# Patient Record
Sex: Female | Born: 1950 | Race: White | Hispanic: No | Marital: Married | Smoking: Former smoker
Health system: Southern US, Community
[De-identification: ages and names within clinical notes are randomized; demographics above are authoritative.]

## PROBLEM LIST (undated history)

## (undated) DIAGNOSIS — F329 Major depressive disorder, single episode, unspecified: Secondary | ICD-10-CM

## (undated) DIAGNOSIS — IMO0001 Reserved for inherently not codable concepts without codable children: Secondary | ICD-10-CM

## (undated) DIAGNOSIS — R011 Cardiac murmur, unspecified: Secondary | ICD-10-CM

## (undated) DIAGNOSIS — I10 Essential (primary) hypertension: Secondary | ICD-10-CM

## (undated) DIAGNOSIS — R0602 Shortness of breath: Secondary | ICD-10-CM

## (undated) DIAGNOSIS — I4892 Unspecified atrial flutter: Secondary | ICD-10-CM

## (undated) DIAGNOSIS — Q211 Atrial septal defect, unspecified: Secondary | ICD-10-CM

## (undated) DIAGNOSIS — K219 Gastro-esophageal reflux disease without esophagitis: Secondary | ICD-10-CM

## (undated) DIAGNOSIS — D649 Anemia, unspecified: Secondary | ICD-10-CM

## (undated) DIAGNOSIS — F32A Depression, unspecified: Secondary | ICD-10-CM

## (undated) DIAGNOSIS — R5383 Other fatigue: Secondary | ICD-10-CM

## (undated) DIAGNOSIS — IMO0002 Reserved for concepts with insufficient information to code with codable children: Secondary | ICD-10-CM

## (undated) DIAGNOSIS — I340 Nonrheumatic mitral (valve) insufficiency: Secondary | ICD-10-CM

## (undated) DIAGNOSIS — I4891 Unspecified atrial fibrillation: Secondary | ICD-10-CM

## (undated) HISTORY — DX: Other fatigue: R53.83

## (undated) HISTORY — DX: Nonrheumatic mitral (valve) insufficiency: I34.0

## (undated) HISTORY — DX: Reserved for inherently not codable concepts without codable children: IMO0001

## (undated) HISTORY — DX: Unspecified atrial flutter: I48.92

## (undated) HISTORY — DX: Essential (primary) hypertension: I10

## (undated) HISTORY — DX: Gastro-esophageal reflux disease without esophagitis: K21.9

## (undated) HISTORY — DX: Atrial septal defect, unspecified: Q21.10

## (undated) HISTORY — DX: Reserved for concepts with insufficient information to code with codable children: IMO0002

## (undated) HISTORY — DX: Atrial septal defect: Q21.1

## (undated) HISTORY — DX: Unspecified atrial fibrillation: I48.91

---

## 2009-08-20 ENCOUNTER — Ambulatory Visit: Payer: Self-pay | Admitting: Thoracic Surgery (Cardiothoracic Vascular Surgery)

## 2010-02-11 ENCOUNTER — Ambulatory Visit
Admission: RE | Admit: 2010-02-11 | Discharge: 2010-02-11 | Payer: Self-pay | Source: Home / Self Care | Attending: Thoracic Surgery (Cardiothoracic Vascular Surgery) | Admitting: Thoracic Surgery (Cardiothoracic Vascular Surgery)

## 2010-02-11 NOTE — Assessment & Plan Note (Signed)
OFFICE VISIT  Briana Moran, Briana Moran DOB:  1951-01-02                                        February 11, 2010 CHART #:  [id number]  HISTORY OF PRESENT ILLNESS:  The patient returns for further followup of her large atrial septal defect.  She was originally seen in consultation on August 20, 2009, and a full history and physical exam were dictated at that time.  Since then, the patient has remained clinically stable.  She did have a death in the family and for a variety of personal reasons she has postponed proceeding with surgical intervention until the present time.  She was seen recently by Dr. Reuel Boom in followup and she subsequently underwent left and right heart catheterization on February 06, 2010, at [facility].  The patient now returns with plans to proceed with surgical intervention in the near future.  REVIEW OF SYSTEMS:  Unchanged from previously.  She specifically denies any productive cough, fevers, shortness of breath, or tachy palpitations.  PAST MEDICAL HISTORY:  Unchanged from previously.  CURRENT MEDICATIONS: 1. Aspirin 325 mg daily. 2. Atenolol 50 mg daily. 3. Flecainide 50 mg twice daily. 4. Triamterene/hydrochlorothiazide 37.5/25 one tablet daily. 5. Calcium with vitamin D supplement twice daily.  PHYSICAL EXAMINATION:  Notable for well-appearing female with blood pressure 148/100, pulse 96, and oxygen saturation 96% on room air. HEENT exam is unrevealing.  Auscultation of the chest reveals clear breath sounds that are symmetrical bilaterally.  Cardiovascular exam is notable for regular rate and rhythm.  There is a grade 3/6 systolic murmur heard best along the sternal border.  The abdomen is soft and nontender.  Bowel sounds are present.  The extremities are warm and well perfused.  There is no lower extremity edema.  DIAGNOSTIC TESTS:  Left and right heart catheterization performed on February 06, 2010 is  notable for the presence of normal coronary arteries with no significant coronary artery disease.  There was mild pulmonary hypertension with PA pressures measured 37/13.  Pulmonary capillary wedge pressure was 12.  Central venous pressure was 12.  Saturation run confirmed the presence of a large shunt at the atrial level with Qp/Qs measured 2.2 to 1.  There is normal left ventricular systolic function with ejection fraction estimated 65%.  Imaging of her descending thoracic and abdominal aorta and iliac vessels was notable for normal anatomy and no significant aortoiliac disease.  IMPRESSION:  Large secundum-type atrial septal defect with history of paroxysmal atrial fibrillation and mild mitral regurgitation.  PLAN:  We plan to proceed with right miniature thoracotomy for patch closure of atrial septal defect and maze procedure on Tuesday, March 05, 2010.  We will take a look at the severity of mitral regurgitation at the time of surgery and consider mitral valve repair as indicated. Based upon previous transesophageal echocardiogram, this will probably not be necessary.  The patient and her husband understand and accept indications, risks, and potential benefits of surgery.  The rationale for use of mini thoracotomy approach has been discussed and contrasted with conventional sternotomy.  All of their questions have been addressed.  Salvatore Decent. Cornelius Moras, M.D. Electronically Signed  CHO/MEDQ  D:  02/11/2010  T:  02/11/2010  Job:  [id number]  cc:   Lanell Matar, DO Dr. Morene Antu

## 2010-03-01 ENCOUNTER — Ambulatory Visit (HOSPITAL_COMMUNITY)
Admission: RE | Admit: 2010-03-01 | Discharge: 2010-03-01 | Disposition: A | Discharge status: Home / Self Care | Payer: 59 | Source: Ambulatory Visit | Attending: Thoracic Surgery (Cardiothoracic Vascular Surgery) | Admitting: Thoracic Surgery (Cardiothoracic Vascular Surgery)

## 2010-03-01 ENCOUNTER — Encounter (HOSPITAL_COMMUNITY)
Admission: RE | Admit: 2010-03-01 | Discharge: 2010-03-01 | Disposition: A | Discharge status: Home / Self Care | Payer: 59 | Source: Ambulatory Visit | Attending: Thoracic Surgery (Cardiothoracic Vascular Surgery) | Admitting: Thoracic Surgery (Cardiothoracic Vascular Surgery)

## 2010-03-01 ENCOUNTER — Other Ambulatory Visit: Payer: Self-pay | Admitting: Thoracic Surgery (Cardiothoracic Vascular Surgery)

## 2010-03-01 DIAGNOSIS — I2510 Atherosclerotic heart disease of native coronary artery without angina pectoris: Secondary | ICD-10-CM

## 2010-03-01 DIAGNOSIS — Q2111 Secundum atrial septal defect: Secondary | ICD-10-CM | POA: Insufficient documentation

## 2010-03-01 DIAGNOSIS — I517 Cardiomegaly: Secondary | ICD-10-CM | POA: Insufficient documentation

## 2010-03-01 DIAGNOSIS — Q233 Congenital mitral insufficiency: Secondary | ICD-10-CM

## 2010-03-01 DIAGNOSIS — Q211 Atrial septal defect: Secondary | ICD-10-CM | POA: Insufficient documentation

## 2010-03-01 DIAGNOSIS — I059 Rheumatic mitral valve disease, unspecified: Secondary | ICD-10-CM | POA: Insufficient documentation

## 2010-03-01 DIAGNOSIS — Z01810 Encounter for preprocedural cardiovascular examination: Secondary | ICD-10-CM | POA: Insufficient documentation

## 2010-03-01 LAB — CBC
MCV: 98.9 fL (ref 78.0–100.0)
Platelets: 174 10*3/uL (ref 150–400)
RBC: 3.59 MIL/uL — ABNORMAL LOW (ref 3.87–5.11)
WBC: 5.6 10*3/uL (ref 4.0–10.5)

## 2010-03-01 LAB — BLOOD GAS, ARTERIAL
Acid-Base Excess: 0.4 mmol/L (ref 0.0–2.0)
Drawn by: 206361
FIO2: 0.21 %
pCO2 arterial: 37.4 mmHg (ref 35.0–45.0)
pH, Arterial: 7.428 — ABNORMAL HIGH (ref 7.350–7.400)
pO2, Arterial: 89.2 mmHg (ref 80.0–100.0)

## 2010-03-01 LAB — URINE MICROSCOPIC-ADD ON

## 2010-03-01 LAB — URINALYSIS, ROUTINE W REFLEX MICROSCOPIC
Leukocytes, UA: NEGATIVE
Nitrite: NEGATIVE
Specific Gravity, Urine: 1.004 — ABNORMAL LOW (ref 1.005–1.030)
Urine Glucose, Fasting: NEGATIVE mg/dL
pH: 7.5 (ref 5.0–8.0)

## 2010-03-01 LAB — COMPREHENSIVE METABOLIC PANEL
Alkaline Phosphatase: 72 U/L (ref 39–117)
BUN: 10 mg/dL (ref 6–23)
Calcium: 9.9 mg/dL (ref 8.4–10.5)
Creatinine, Ser: 0.89 mg/dL (ref 0.4–1.2)
Glucose, Bld: 119 mg/dL — ABNORMAL HIGH (ref 70–99)
Potassium: 4.2 mEq/L (ref 3.5–5.1)
Total Protein: 7.2 g/dL (ref 6.0–8.3)

## 2010-03-01 LAB — PROTIME-INR
INR: 0.99 (ref 0.00–1.49)
Prothrombin Time: 13.3 seconds (ref 11.6–15.2)

## 2010-03-01 LAB — HEMOGLOBIN A1C
Hgb A1c MFr Bld: 5.2 % (ref <5.7)
Mean Plasma Glucose: 103 mg/dL (ref <117)

## 2010-03-05 ENCOUNTER — Inpatient Hospital Stay (HOSPITAL_COMMUNITY)
Admission: RE | Admit: 2010-03-05 | Discharge: 2010-03-12 | DRG: 229 | Disposition: A | Discharge status: Home / Self Care | Payer: 59 | Source: Ambulatory Visit | Attending: Thoracic Surgery (Cardiothoracic Vascular Surgery) | Admitting: Thoracic Surgery (Cardiothoracic Vascular Surgery)

## 2010-03-05 ENCOUNTER — Inpatient Hospital Stay (HOSPITAL_COMMUNITY): Payer: 59

## 2010-03-05 DIAGNOSIS — I4891 Unspecified atrial fibrillation: Secondary | ICD-10-CM

## 2010-03-05 DIAGNOSIS — Y9210 Unspecified residential institution as the place of occurrence of the external cause: Secondary | ICD-10-CM | POA: Diagnosis not present

## 2010-03-05 DIAGNOSIS — T81500A Unspecified complication of foreign body accidentally left in body following surgical operation, initial encounter: Secondary | ICD-10-CM | POA: Diagnosis not present

## 2010-03-05 DIAGNOSIS — D696 Thrombocytopenia, unspecified: Secondary | ICD-10-CM | POA: Diagnosis not present

## 2010-03-05 DIAGNOSIS — D62 Acute posthemorrhagic anemia: Secondary | ICD-10-CM | POA: Diagnosis not present

## 2010-03-05 DIAGNOSIS — I498 Other specified cardiac arrhythmias: Secondary | ICD-10-CM | POA: Diagnosis not present

## 2010-03-05 DIAGNOSIS — I059 Rheumatic mitral valve disease, unspecified: Secondary | ICD-10-CM | POA: Diagnosis present

## 2010-03-05 DIAGNOSIS — Q211 Atrial septal defect: Principal | ICD-10-CM

## 2010-03-05 DIAGNOSIS — Q2111 Secundum atrial septal defect: Principal | ICD-10-CM

## 2010-03-05 DIAGNOSIS — T81509A Unspecified complication of foreign body accidentally left in body following unspecified procedure, initial encounter: Secondary | ICD-10-CM | POA: Diagnosis not present

## 2010-03-05 DIAGNOSIS — E8779 Other fluid overload: Secondary | ICD-10-CM | POA: Diagnosis not present

## 2010-03-05 HISTORY — PX: ASD REPAIR: SHX258

## 2010-03-05 HISTORY — PX: MAZE: SHX5063

## 2010-03-05 LAB — POCT I-STAT 3, ART BLOOD GAS (G3+)
Acid-base deficit: 4 mmol/L — ABNORMAL HIGH (ref 0.0–2.0)
Bicarbonate: 20 mEq/L (ref 20.0–24.0)
Bicarbonate: 20.7 mEq/L (ref 20.0–24.0)
O2 Saturation: 97 %
O2 Saturation: 99 %
O2 Saturation: 99 %
TCO2: 23 mmol/L (ref 0–100)
TCO2: 21 mmol/L (ref 0–100)
TCO2: 22 mmol/L (ref 0–100)
pCO2 arterial: 30.5 mmHg — ABNORMAL LOW (ref 35.0–45.0)
pCO2 arterial: 33.9 mmHg — ABNORMAL LOW (ref 35.0–45.0)
pO2, Arterial: 77 mmHg — ABNORMAL LOW (ref 80.0–100.0)
pO2, Arterial: 128 mmHg — ABNORMAL HIGH (ref 80.0–100.0)

## 2010-03-05 LAB — CBC
HCT: 24.7 % — ABNORMAL LOW (ref 36.0–46.0)
Hemoglobin: 9 g/dL — ABNORMAL LOW (ref 12.0–15.0)
MCH: 34.1 pg — ABNORMAL HIGH (ref 26.0–34.0)
MCV: 100 fL (ref 78.0–100.0)
MCV: 100.8 fL — ABNORMAL HIGH (ref 78.0–100.0)
Platelets: 88 10*3/uL — ABNORMAL LOW (ref 150–400)
RBC: 2.64 MIL/uL — ABNORMAL LOW (ref 3.87–5.11)
RBC: 2.45 MIL/uL — ABNORMAL LOW (ref 3.87–5.11)
WBC: 10.9 10*3/uL — ABNORMAL HIGH (ref 4.0–10.5)

## 2010-03-05 LAB — GLUCOSE, CAPILLARY: Glucose-Capillary: 100 mg/dL — ABNORMAL HIGH (ref 70–99)

## 2010-03-05 LAB — POCT I-STAT, CHEM 8
BUN: 13 mg/dL (ref 6–23)
Chloride: 108 mEq/L (ref 96–112)
Creatinine, Ser: 0.9 mg/dL (ref 0.4–1.2)
Glucose, Bld: 124 mg/dL — ABNORMAL HIGH (ref 70–99)
Hemoglobin: 9.2 g/dL — ABNORMAL LOW (ref 12.0–15.0)
Potassium: 4.5 mEq/L (ref 3.5–5.1)

## 2010-03-05 LAB — HEMOGLOBIN AND HEMATOCRIT, BLOOD: HCT: 23.1 % — ABNORMAL LOW (ref 36.0–46.0)

## 2010-03-05 LAB — PLATELET COUNT: Platelets: 88 10*3/uL — ABNORMAL LOW (ref 150–400)

## 2010-03-05 LAB — PROTIME-INR: Prothrombin Time: 19.2 seconds — ABNORMAL HIGH (ref 11.6–15.2)

## 2010-03-05 LAB — CREATININE, SERUM: GFR calc non Af Amer: 60 mL/min — ABNORMAL LOW (ref >60)

## 2010-03-05 LAB — MAGNESIUM: Magnesium: 2.6 mg/dL — ABNORMAL HIGH (ref 1.5–2.5)

## 2010-03-06 ENCOUNTER — Inpatient Hospital Stay (HOSPITAL_COMMUNITY): Payer: 59

## 2010-03-06 LAB — CBC
HCT: 25.5 % — ABNORMAL LOW (ref 36.0–46.0)
MCHC: 34.5 g/dL (ref 30.0–36.0)
MCV: 97.7 fL (ref 78.0–100.0)
Platelets: 89 10*3/uL — ABNORMAL LOW (ref 150–400)
RDW: 14.8 % (ref 11.5–15.5)

## 2010-03-06 LAB — BASIC METABOLIC PANEL
BUN: 10 mg/dL (ref 6–23)
Calcium: 8.5 mg/dL (ref 8.4–10.5)
GFR calc non Af Amer: 58 mL/min — ABNORMAL LOW (ref >60)
Glucose, Bld: 100 mg/dL — ABNORMAL HIGH (ref 70–99)
Sodium: 139 mEq/L (ref 135–145)

## 2010-03-06 LAB — POCT I-STAT 4, (NA,K, GLUC, HGB,HCT)
Glucose, Bld: 108 mg/dL — ABNORMAL HIGH (ref 70–99)
Glucose, Bld: 108 mg/dL — ABNORMAL HIGH (ref 70–99)
Glucose, Bld: 94 mg/dL (ref 70–99)
HCT: 31 % — ABNORMAL LOW (ref 36.0–46.0)
HCT: 30 % — ABNORMAL LOW (ref 36.0–46.0)
HCT: 24 % — ABNORMAL LOW (ref 36.0–46.0)
HCT: 27 % — ABNORMAL LOW (ref 36.0–46.0)
Hemoglobin: 10.5 g/dL — ABNORMAL LOW (ref 12.0–15.0)
Hemoglobin: 8.5 g/dL — ABNORMAL LOW (ref 12.0–15.0)
Hemoglobin: 9.2 g/dL — ABNORMAL LOW (ref 12.0–15.0)
Potassium: 3.4 mEq/L — ABNORMAL LOW (ref 3.5–5.1)
Potassium: 3.8 mEq/L (ref 3.5–5.1)
Sodium: 135 mEq/L (ref 135–145)
Sodium: 134 mEq/L — ABNORMAL LOW (ref 135–145)

## 2010-03-06 LAB — POCT I-STAT 3, ART BLOOD GAS (G3+)
Bicarbonate: 23.2 mEq/L (ref 20.0–24.0)
Bicarbonate: 22.7 mEq/L (ref 20.0–24.0)
TCO2: 24 mmol/L (ref 0–100)
TCO2: 24 mmol/L (ref 0–100)
pCO2 arterial: 50.6 mmHg — ABNORMAL HIGH (ref 35.0–45.0)
pCO2 arterial: 35.7 mmHg (ref 35.0–45.0)
pCO2 arterial: 35.3 mmHg (ref 35.0–45.0)
pH, Arterial: 7.358 (ref 7.350–7.400)
pH, Arterial: 7.434 — ABNORMAL HIGH (ref 7.350–7.400)
pH, Arterial: 7.453 — ABNORMAL HIGH (ref 7.350–7.400)
pH, Arterial: 7.416 — ABNORMAL HIGH (ref 7.350–7.400)
pO2, Arterial: 347 mmHg — ABNORMAL HIGH (ref 80.0–100.0)
pO2, Arterial: 179 mmHg — ABNORMAL HIGH (ref 80.0–100.0)

## 2010-03-06 LAB — GLUCOSE, CAPILLARY: Glucose-Capillary: 100 mg/dL — ABNORMAL HIGH (ref 70–99)

## 2010-03-06 LAB — POCT I-STAT GLUCOSE
Glucose, Bld: 111 mg/dL — ABNORMAL HIGH (ref 70–99)
Operator id: 3408

## 2010-03-06 NOTE — Op Note (Signed)
NAMEVAYLA, WILHELMI              ACCOUNT NO.:  [id number]  MEDICAL RECORD NO.:  [id number]           PATIENT TYPE:  I  LOCATION:  2308                         FACILITY:  [facility]  PHYSICIAN:  Salvatore Decent. Cornelius Moras, M.D. DATE OF BIRTH:  [birth year]  DATE OF PROCEDURE:  03/05/2010 DATE OF DISCHARGE:                              OPERATIVE REPORT   PREOPERATIVE DIAGNOSES: 1. Secundum-type atrial septal defect. 2. Persistent atrial fibrillation.  POSTOPERATIVE DIAGNOSES: 1. Secundum-type atrial septal defect. 2. Persistent atrial fibrillation.  PROCEDURE:  Right miniature thoracotomy for patch closure of atrial septal defect and Cox Cryo Maze procedure (full bilateral lesion set).  BRIEF CLINICAL NOTE:  The patient is a 60 year old female with known history of secundum-type atrial septal defect dating back at least 7 years previously.  The patient has been advised on numerous occasions that she should consider elective closure.  Transesophagealechocardiogram confirms a very large secundum-type atrial septal defect with anatomy completely unfavorable for percutaneous closure due to the size of the defect and the lack of an associated appropriate interatrial septal rim.  The patient has developed persistent atrial fibrillation as well as exertional shortness of breath.  She now presents for elective surgical intervention.  Left and right heart catheterization confirms the presence of normal coronary artery anatomy with no significant coronary artery disease.  There is mild pulmonary hypertension with a very large interatrial shunt with Qp/Qs measured 2.2-1.  There is normal left ventricular systolic function.  The patient has been counseled regarding the indications, risks, and potential benefits of surgery. Alternative treatment strategies have been discussed.  She understands and accepts all associated risks and desires to proceed as described. Alternatives of surgical approaches have  been reviewed in detail and the patient specifically requests that minimally invasive techniques be employed in an effort to avoid the need for a conventional sternotomy.  OPERATIVE FINDINGS: 1. Very large secundum-type atrial septal defect. 2. Trivial mitral regurgitation. 3. Normal left ventricular systolic function. 4. Dilated right ventricle and right atrium.  OPERATIVE PROCEDURE IN DETAIL:  The patient was brought to the operating room on the above-mentioned date and central monitoring was established by the Anesthesia Team under the care and direction of Dr. Kipp Brood. Specifically, a Cordis introducing sheath and central venous catheter was placed via the left internal jugular approach.  A radial arterial line was placed.  Intravenous antibiotics were administered.  The patient was placed in the supine position on the operating table. General endotracheal anesthesia was induced uneventfully.  The patient was initially intubated using a dual-lumen endotracheal tube.  A Foley catheter was placed.  The patient was placed in the supine position on the operating table with a soft roll behind the right scapula and the neck gently extended and turned towards the left.  The patient's right neck, chest, abdomen, both groins, and both lower extremities were prepared and draped in a sterile manner.  Baseline transesophageal echocardiogram was performed by Dr. Noreene Larsson. This confirmed the presence of a very large secundum-type atrial septal defect.  There was trivial mitral regurgitation.  There was normal left ventricular systolic function.  The right ventricle was  dilated.  There was mild-to-moderate tricuspid regurgitation.  No other significant abnormalities were noted.  A small incision was made in the right inguinal crease and the anterior surface of the right common femoral artery and right common femoral vein were dissected through the incision.  The femoral artery was normal  in appearance.  Single lung ventilation was begun.  A right miniature anterolateral thoracotomy incision was performed just lateral to the right breast. The pectoralis major muscle was retracted medially and completely preserved.  The right pleural space was entered through the third intercostal space.  A soft tissue retractor was placed.  Two 11-mm ports were placed through the right inframammary crease.  The right pleural space was insufflated with carbon dioxide gas continuously through the posterior port.  A longitudinal incision was made in the pericardium 3 cm anterior to the phrenic nerve.  Silk traction sutures were placed on either side to retract the pericardium for exposure.  The patient was placed into Trendelenburg position.  A small stab incision was made low in the right neck.  The right internal jugular vein was cannulated with a Seldinger technique and flexible guidewire was advanced into the right atrium.  The patient was heparinized systemically.  A purse-string suture was placed on the anterior surface of the right common femoral vein.  Two concentric purse-string sutures were placed on the anterior surface of the right common femoral artery. The right common femoral vein was cannulated with a Seldinger technique and a flexible guidewire was advanced up through the inferior vena cava through the right atrium with transesophageal echocardiogram utilized to verify appropriate guidewire position.  The femoral vein was dilated with serial dilators and subsequently cannulated with a 22-French femoral venous cannula.  The cannula was advanced until the tip of the cannula, it just extended into the right atrium.  The femoral artery was now cannulated with a Seldinger technique and a flexible guidewire was advanced up until the guidewire was identified intraluminally in the descending thoracic aorta using transesophageal echocardiogram.  The femoral artery was dilated with  serial dilators and subsequently cannulated with a 16-French femoral arterial cannula.  The right internal jugular vein was now dilated with serial dilators and cannulated with a 14-French pediatric femoral venous cannula.  Cardiopulmonary bypass was begun.  Vacuum assist venous drainage was utilized.  Venous drainage and exposure was notably excellent.  The incision in the pericardium was extended in both directions to facilitate exposure.  Umbilical tapes were placed around the superior vena cava and the inferior vena cava.  An antegrade cardioplegic cannula was placed directly in the proximal ascending thoracic aorta.  The patient was cooled to 28 degrees systemic temperature.  The aortic crossclamp was applied and cold blood cardioplegia was delivered in an antegrade fashion through the aortic root.  The initial cardioplegia was wrapped with early diastolic arrest.  Repeat doses of cardioplegia were administered every 20 minutes throughout the entire crossclamp portion of the operation through the aortic root to maintain completely flat electrocardiogram.  Left atriotomy incision was performed posteriorly through the interatrial groove and continued partway across the back wall of the left atrium after opening the oblique sinus inferiorly.  The secundum- type atrial septal defect was obviously noticed on the floor of the right atrium just above the atriotomy incision.  The vessel loops were now secured around the superior vena cava and the inferior vena cava.  A retractor blade was placed into the left atrium and attached to a separate sidearm placed  through a small stab incision just to the right side of the sternum through the third intercostal space.  Exposure of the floor of left atrium was felt to be adequate.  The left-sided lesion set of the Cox Cryo Maze procedure was now performed.  Each lesion was performed using 3-minute freeze times.  Initially, a lesion was  created across the back wall of the left atrium from the inferior apex of the left atriotomy incision onto the coronary sinus posteriorly.  A mirror image lesion was now performed on the endocardial surface of the left atrium from the inferior apex of the atriotomy incision to reach the posterior rim of the mitral valve annulus in the neighborhood of the P2- P3 region.  An elliptical lesion was now created to isolate the pulmonary veins completely.  A lesion was placed across the dome of left atrium from the cephalad apex of the left atriotomy incision to reach the anterior rim of the left-sided pulmonary veins.  This lesion was completed inferiorly by placing another lesion across the back wall of left atrium from the caudad apex of the atriotomy incision to reach the same ellipse.  This completed the left-sided lesion set of the Cox Cryo Maze procedure.  A sump drain was placed across the mitral valve and the left atriotomy incision was closed posteriorly using a 2-layer closure of running 3-0 Prolene suture.  A right atrial lateral wall lesion was created with the Cryo probe beginning from the superior vena cava set in a cephalad direction along the lateral wall of the right atrium to reach the inferior vena cava inferiorly.  After this was completed, an oblique right atriotomy incision was performed extending from this cryolesion posteriorly towards the acute margin of the heart.  Several traction sutures were placed into the right atrium to retract the wall to facilitate exposure. The large secundum-type atrial septal defect was easily noticed.  A patch of CorMatrix bovine submucosal tissue patch was soaked in saline for 10 minutes per manufacturer recommendation and ultimately trimmed to an elliptical shape for patch closure of atrial septal defect.  The patch was sewn in place using a 2-layer closure of running 4-0 Prolene suture.  A second layer was chosen because of the large  size of the patch and the large size of the defect with somewhat limited visualization to make sure that the patch closure was completely hemostatic circumferentially.  The remainder of the right-sided lesion set of Cox Cryo Maze procedure was now performed.  Initially, the endocardial lesion was placed from the anterior apex of the right atriotomy incision along the endocardial surface towards the anterolateral surface of the tricuspid annulus.  A 10 o'clock lesion was now performed from the anteromedial surface of the tricuspid annulus towards the tip of the right atrial appendage. Finally, a right atrial flutter lesion was performed from the posterior rim of the tricuspid annulus across the isthmus inferiorly.  The aortic crossclamp was removed after a total crossclamp time of 124 minutes.  The right atriotomy incision was now closed using a 2-layer closure of running 3-0 Prolene suture.  The lungs were ventilated and the heart allowed to fill to evacuate any potential residual air.  After this, the left ventricular vent cannula was removed.  The patient was rewarmed to 37 degrees centigrade temperature.  The umbilical tapes were removed from the superior vena cava and the inferior vena cava.  The antegrade cardioplegic cannula was removed. Epicardial pacing wires were fixed to the undersurface  of right ventricular free wall into the right atrial appendage.  Normal sinus rhythm resumed spontaneously.  Atrial pacing was employed to increase the heart rate.  The patient was weaned from cardiopulmonary bypass without difficulty. The patient's rhythm at separation from bypass was normal sinus rhythm. Atrial pacing was employed to increase the heart rate.  No inotropic support was required.  Total cardiopulmonary bypass time for the operation was 209 minutes.  Followup transesophageal echocardiogram performed by Dr. Noreene Larsson after separation from bypass demonstrated normal left  ventricular systolic function.  There remained only trivial mitral regurgitation.  There was an intact patch closure of the previously noted atrial septal defect.  There was no sign of any residual interatrial communication, whatsoever.  Tricuspid regurgitation was noticeably dramatically decreased with markedly decreased size of the right ventricle and right atrium and only trace to mild tricuspid regurgitation remaining.  Protamine was administered to reverse the anticoagulation.  The femoral venous and arterial cannulae were removed uneventfully and the purse- string sutures were secured.  A palpable pulse was noted in the distal right common femoral artery.  The right internal jugular cannula was removed and manual pressure was held on the right neck for 30 minutes.  Single lung ventilation was begun.  The right and left atriotomy incisions were inspected for hemostasis.  There was some bleeding noted from the left atriotomy incision and this was controlled with pledgeted sutures.  The pericardial space was drained with a 28-French Bard drain placed through the anterior port incision.  The pericardium was closed laterally using a patch of CorMatrix.  The right pleural space was irrigated with saline solution and inspected for hemostasis.  The On-Q continuous pain management system was utilized to facilitate postoperative pain control.  One 5-inch catheter was applied with the On- Q kit.  This was tunneled through the subcutaneous tissues from anterior to the poster port incision.  The catheter was then tunneled through the intercostal space into the subpleural space posteriorly to cover the second through the sixth intercostal nerve roots.  The catheter was flushed with 5 mL of 0.5% bupivacaine solution and ultimately connected to a continuous infusion pump.  The pleural space was drained with a 28- French Bard drain placed through the poster port incision.  The minithoracotomy  incision was closed in multiple layers in a routine fashion.  The skin incision was closed with subcuticular skin closure. The right groin incision was irrigated with saline solution and inspected for hemostasis.  The groin incision was also closed in multiple layers and the skin incision was closed with subcuticular skin closure.  The patient tolerated the procedure well.  The patient was reintubated using a single-lumen endotracheal tube and transported to the Surgical Intensive Care Unit in stable condition.  There were no intraoperative complications.  All sponge, instrument, and needle counts were verified as correct at the completion of the operation.  No blood products were administered.     Salvatore Decent. Cornelius Moras, M.D.     CHO/MEDQ  D:  03/05/2010  T:  03/06/2010  Job:  [id number]  cc:   Lanell Matar, DO Dr. Morene Antu  Electronically Signed by Tressie Stalker M.D. on 03/06/2010 09:05:21 AM

## 2010-03-07 ENCOUNTER — Inpatient Hospital Stay (HOSPITAL_COMMUNITY): Payer: 59

## 2010-03-07 LAB — BASIC METABOLIC PANEL
Calcium: 8.4 mg/dL (ref 8.4–10.5)
GFR calc Af Amer: 52 mL/min — ABNORMAL LOW (ref >60)
GFR calc non Af Amer: 43 mL/min — ABNORMAL LOW (ref >60)
Potassium: 3.6 mEq/L (ref 3.5–5.1)
Sodium: 133 mEq/L — ABNORMAL LOW (ref 135–145)

## 2010-03-07 LAB — CBC
Hemoglobin: 7.9 g/dL — ABNORMAL LOW (ref 12.0–15.0)
RBC: 2.33 MIL/uL — ABNORMAL LOW (ref 3.87–5.11)
WBC: 8.9 10*3/uL (ref 4.0–10.5)

## 2010-03-07 LAB — PROTIME-INR
INR: 1.22 (ref 0.00–1.49)
Prothrombin Time: 15.6 seconds — ABNORMAL HIGH (ref 11.6–15.2)

## 2010-03-08 LAB — CBC
MCH: 32.9 pg (ref 26.0–34.0)
Platelets: 99 10*3/uL — ABNORMAL LOW (ref 150–400)
RBC: 2.43 MIL/uL — ABNORMAL LOW (ref 3.87–5.11)
RDW: 14.6 % (ref 11.5–15.5)
WBC: 9 10*3/uL (ref 4.0–10.5)

## 2010-03-08 LAB — PROTIME-INR: Prothrombin Time: 15 seconds (ref 11.6–15.2)

## 2010-03-09 ENCOUNTER — Inpatient Hospital Stay (HOSPITAL_COMMUNITY): Payer: 59

## 2010-03-09 LAB — TYPE AND SCREEN
ABO/RH(D): A POS
Antibody Screen: NEGATIVE
Unit division: 0
Unit division: 0

## 2010-03-09 LAB — PROTIME-INR: Prothrombin Time: 15.3 seconds — ABNORMAL HIGH (ref 11.6–15.2)

## 2010-03-10 ENCOUNTER — Inpatient Hospital Stay (HOSPITAL_COMMUNITY): Payer: 59

## 2010-03-10 LAB — PROTIME-INR: Prothrombin Time: 16.5 seconds — ABNORMAL HIGH (ref 11.6–15.2)

## 2010-03-10 MED ORDER — IOHEXOL 300 MG/ML  SOLN
100.0000 mL | Freq: Once | INTRAMUSCULAR | Status: AC | PRN
  Administered 2010-03-10: 100 mL via INTRAVENOUS

## 2010-03-11 ENCOUNTER — Inpatient Hospital Stay (HOSPITAL_COMMUNITY): Payer: 59

## 2010-03-11 ENCOUNTER — Other Ambulatory Visit: Payer: Self-pay | Admitting: Surgery

## 2010-03-11 LAB — PROTIME-INR
INR: 1.29 (ref 0.00–1.49)
Prothrombin Time: 16.3 seconds — ABNORMAL HIGH (ref 11.6–15.2)

## 2010-03-11 LAB — CBC
Hemoglobin: 7.4 g/dL — ABNORMAL LOW (ref 12.0–15.0)
MCH: 33.8 pg (ref 26.0–34.0)
Platelets: 173 10*3/uL (ref 150–400)
RBC: 2.19 MIL/uL — ABNORMAL LOW (ref 3.87–5.11)
WBC: 5.6 10*3/uL (ref 4.0–10.5)

## 2010-03-11 LAB — BASIC METABOLIC PANEL
Calcium: 9 mg/dL (ref 8.4–10.5)
Creatinine, Ser: 0.78 mg/dL (ref 0.4–1.2)
GFR calc Af Amer: >60 mL/min (ref >60)
GFR calc non Af Amer: >60 mL/min (ref >60)
Sodium: 134 mEq/L — ABNORMAL LOW (ref 135–145)

## 2010-03-11 LAB — APTT: aPTT: 42 seconds — ABNORMAL HIGH (ref 24–37)

## 2010-03-11 MED ORDER — IOHEXOL 300 MG/ML  SOLN: 100.0000 mL | Freq: Once | INTRAMUSCULAR | Status: AC | PRN

## 2010-03-12 LAB — CBC
Hemoglobin: 7.3 g/dL — ABNORMAL LOW (ref 12.0–15.0)
MCH: 33.6 pg (ref 26.0–34.0)
Platelets: 226 10*3/uL (ref 150–400)
RBC: 2.17 MIL/uL — ABNORMAL LOW (ref 3.87–5.11)
WBC: 5.7 10*3/uL (ref 4.0–10.5)

## 2010-03-12 LAB — PROTIME-INR
INR: 1.29 (ref 0.00–1.49)
Prothrombin Time: 16.3 seconds — ABNORMAL HIGH (ref 11.6–15.2)

## 2010-03-15 ENCOUNTER — Other Ambulatory Visit: Payer: Self-pay | Admitting: Thoracic Surgery (Cardiothoracic Vascular Surgery)

## 2010-03-15 DIAGNOSIS — Q211 Atrial septal defect: Secondary | ICD-10-CM

## 2010-03-15 NOTE — Discharge Summary (Signed)
  NAMECORNELLA, Briana Moran NO.:  [id number]  MEDICAL RECORD NO.:  [id number]           PATIENT TYPE:  I  LOCATION:  2018                         FACILITY:  [facility]  PHYSICIAN:  Salvatore Decent. Cornelius Moras, M.D. DATE OF BIRTH:  24-Aug-1950  DATE OF ADMISSION:  03/05/2010 DATE OF DISCHARGE:  03/01/2010                              DISCHARGE SUMMARY   ADDENDUM: On postop day #3, March 08, 2010, the patient is noted to be afebrile.  Heart rate is junctional/sinus brady.  Blood pressure well controlled.  O2 sats greater than 90% on room air.  The patient is tentatively ready for discharge to home on Sunday.  Pending chest tubes able to be discontinued tomorrow with chest tube drainage decreased as well as heart rate remained stable.  We will continue monitoring the patient's vital signs and ambulating three times per day.  FOLLOWUP APPOINTMENTS:  A followup appointment has been arranged with Dr. Cornelius Moras for March 18, 2010 at 3:30 p.m.  The patient will need to obtain PA and lateral chest x-ray 30 minutes prior to this appointment. She will need to follow up with Dr. Reuel Boom in 2 weeks.  She will need to contact his office to make these arrangements.  The patient will also need to obtain PT/INR blood work on March 13, 2010 at Dr. Rosann Auerbach office and she will need to contact him to make these arrangements.  ACTIVITY:  The patient is instructed no driving until released to do so, no heavy lifting over 10 pounds.  She is told to ambulate 3-4 times per day, progress as tolerated, and continue her breathing exercises.  INCISIONAL CARE:  The patient is told to shower, washing her incisions using soap and water.  She is to contact the office if she develops any drainage or opening from any of her incision sites.  DIET:  The patient educated on diet to be low fat, low salt.  DISCHARGE MEDICATIONS: 1. Lasix 40 mg daily x7 days. 2. Oxycodone 5 mg one to two tablets q.3 h. p.r.n.  pain. 3. Potassium chloride 20 mEq daily x7 days. 4. Coumadin 2.5 mg daily. 5. Aspirin 81 mg daily. 6. Calcium carbonate/vitamin D daily. 7. Fish oil one capsule b.i.d. 8. Vitamin D3 daily.     Sol Blazing, PA   ______________________________ Salvatore Decent. Cornelius Moras, M.D.    KMD/MEDQ  D:  03/08/2010  T:  03/08/2010  Job:  [id number]  cc:   Lanell Matar, DO  Electronically Signed by Cameron Proud PA on 03/13/2010 10:50:22 AM Electronically Signed by Tressie Stalker M.D. on 03/15/2010 06:48:48 AM

## 2010-03-15 NOTE — Discharge Summary (Signed)
Briana Moran, [name] NO.:  [id number]  MEDICAL RECORD NO.:  [id number]           PATIENT TYPE:  I  LOCATION:  2018                         FACILITY:  [facility]  PHYSICIAN:  Salvatore Decent. Cornelius Moras, M.D. DATE OF BIRTH:  1950/05/30  DATE OF ADMISSION:  03/05/2010 DATE OF DISCHARGE:                              DISCHARGE SUMMARY   ADDENDUM  This is an addendum to the patient's discharge summary dictated on March 08, 2010.  The patient was dictated in anticipation of discharge to home on Sunday.  The patient continued to progress well. She remained afebrile, in sinus rhythm.  Blood pressure was well controlled.  She was up ambulating well without difficulty.  She was tolerating diet well.  No nausea, vomiting noted.  Followup PA and lateral chest x-ray obtained on March 10, 2010, showed a curved radiopaque tubing projecting over the cardiac silhouette on both the frontal and lateral views.  All IV catheters and tubes had been removed. Prior x-rays were reviewed which did show this was present on initial postop chest x-ray but now that everything has been removed, it looks like a piece of a catheter had inadvertantly remained intracardiac.  Dr. Laneta Simmers reviewed the films with one other radiologist.  They decided to order a CT scan for further evaluation.  CT angio was done on March 10, 2010, which did show tubular radiopaque foreign body within the lower SVC through the right atrium extending into the coronary sinus consistent with retained catheter fragment when correlated with prior chest films. Dr. Laneta Simmers discussed these findings with the radiologists, Dr. Miles Costain. After discussion, it was felt that Radiology will attempt to remove the catheter fragment on Monday, March 11, 2010.  Dr. Miles Costain discussed this with the patient.  He discussed risks and benefits with the patient.  The patient acknowledged understanding and agreed to proceed. On March 11, 2010, Dr.  Miles Costain was able to remove a transcatheter foreign body without difficulty.  The retained fragment turned out to be a portion of the Swan-Ganz PA catheter that had been removed by the anesthesia team intraoperatively at the termination of the patient's original operation.  The patient tolerated  removal of this fragment well and was transferred back to 2000 in stable condition.  We will plan to monitor the patient for another 24 hours prior to discharge to home. She did have a.m. labs today on March 11, 2010, and showed white blood cell count 5.6, hemoglobin 7.4, hematocrit 21.8, platelet count 174.  Sodium of 134, potassium of 4.0, chloride of 99, bicarbonate 28, BUN of 11, creatinine 1.78, glucose 107.  INR is 1.29.  The patient was restarted on Coumadin following above procedure and coumadin was adjusted appropriately per her INR.  She has been started on p.o. iron for acute blood loss anemia and we will follow up with her anemia in the a.m. with a repeat CBC.  The patient remained stable.  She will be discharged to home in the a.m. on March 12, 2010.  Please see dictated discharge summary for follow-up appointments and discharge instructions.  DISCHARGE MEDICATIONS: 1. Lasix 40 mg daily x7 days.  2. Oxycodone 5 mg one to two tabs q.3 h. p.r.n. pain. 3. Potassium chloride 20 mEq daily x7 days. 4. Coumadin 2.5 mg daily. 5. Nu-Iron 150 mg daily. 6. Aspirin 81 mg daily. 7. Calcium carbonate/vitamin D daily. 8. Fish oil daily b.i.d. 9. Vitamin D3 daily.     Sol Blazing, PA   ______________________________ Salvatore Decent. Cornelius Moras, M.D.    KMD/MEDQ  D:  03/11/2010  T:  03/12/2010  Job:  [id number]  cc:   Lanell Matar, DO  Electronically Signed by Cameron Proud PA on 03/13/2010 10:52:00 AM Electronically Signed by Tressie Stalker M.D. on 03/15/2010 06:52:50 AM

## 2010-03-15 NOTE — Discharge Summary (Signed)
Briana Moran, Briana Moran NO.:  [id number]  MEDICAL RECORD NO.:  [id number]           PATIENT TYPE:  I  LOCATION:  2018                         FACILITY:  [facility]  PHYSICIAN:  Salvatore Decent. Cornelius Moras, M.D. DATE OF BIRTH:  Oct 11, 1950  DATE OF ADMISSION:  03/05/2010 DATE OF DISCHARGE:  03/01/2010                              DISCHARGE SUMMARY   FINAL DIAGNOSES: 1. Secundum type atrial septal defect. 2. Persistent atrial fibrillation.  IN-HOSPITAL DIAGNOSES: 1. Acute blood loss anemia postoperatively. 2. Volume overload postoperatively.  SECONDARY DIAGNOSES: 1. Hypertension. 2. Status post bilateral wrist surgery process.  IN-HOSPITAL OPERATIONS AND PROCEDURES: 1. Right miniature thoracotomy for patch closure of atrial septal     defect and Cox-CryoMaze procedure, full bilateral lesion set. 2. Intraoperative transesophageal echocardiogram.  HISTORY AND PHYSICAL AND HOSPITAL COURSE:  The patient is a 60 year old female with known history of a secundum type atrial septal defect seen back at least 7 years previously.  The patient has been advised on numerous occasions that she should consider elective closure.  TEE confirmed a very large secundum type atrial septal defect with anatomy completely unfavorable for percutaneous closure due to the size of the defect and a lack of an associated appropriate intra-atrial septal rhythm.  The patient has developed persistent atrial fibrillation as well as exertional shortness of breath.  She was seen and evaluated by Dr. Cornelius Moras for elective surgical intervention.  Prior to this, she underwent left and right heart catheterization, confirming the presence of normal coronary artery anatomy with no significant coronary artery disease.  There is mild pulmonary hypertension with a very large intra- atrial shunt.  There is normal left ventricular systolic function.  Dr. Cornelius Moras discussed with the patient undergoing mini right thoracotomy  with closure of ASD as well as Cox-CryoMaze procedure.  He discussed risks and benefits with the patient.  The patient acknowledged her understanding and agreed to proceed.  Surgery was scheduled for March 05, 2010.  For further details of the patient's past medical history and physical exam, please see dictated H and P.  The patient was taken to the operating room on March 05, 2010, where she underwent right miniature thoracotomy for patch closure of atrial septal defect and Cox-CryoMaze procedure, full bilateral lesion set. The patient tolerated this procedure well and transferred to the intensive care unit in stable condition.  Postoperatively, the patient was noted to be hemodynamically stable.  She was extubated on the evening of surgery.  Postextubation, the patient was noted to be alert and oriented x4.  Neuro intact.  On postop day #1, the patient was A pacing.  Blood pressure was stable and all drips were able to be weaned and discontinued.  She was satting 100% on 2 liters of nasal cannula. She did have some mild acute blood loss anemia with hemoglobin and hematocrit of 8.8 and 25.5%.  She also had some mild volume overload. Chest x-ray obtained was stable and chest tubes have minimal drainage. Chest tubes remained in to monitor output.  The patient was felt to be stable and transferred up to PCTU on postop day #1.  On telemetry  floor, the patient continued to progress well.  She was remaining A paced and noted underneath her pacer, she was in junctional rhythm with sinus brady.  Blood pressure was noted to be stable.  She was not started on a beta blocker or restarted on her flecainide.  Currently, the external pacer has been turned off and the patient remains in junctional/sinus bradycardic and will monitor her heart rate.  Blood pressure at this time is stable and well controlled.  We will continue to monitor.  The patient was started on Coumadin postoperatively.   Daily PT/INRs were obtained and monitored closely.  Coumadin was adjusted appropriately. Most recent INR on postop day #3 was 1.16.  The patient had followup chest x-rays which remained stable.  Currently, continued to monitor chest tube output.  If decrease in chest tube output over the next 24 hours, can discontinue chest tubes.  We will plan for followup chest x- ray following removal of chest tubes.  The patient's hemoglobin and hematocrit remained stable.  She was started on diuretics for volume overload and this is improving.  Currently, she is 2.5 kg above her preoperative weight.  The patient has been up ambulating well with cardiac rehab.  She is tolerating diet well.  No nausea, vomiting noted. All incisions are clean, dry, and intact and healing well.  On postop day #3, March 08, 2010, the patient's white blood cell count is 9.0, hemoglobin is 8.0, hematocrit 24.5, platelet count of 99. INR of 1.16.  Sodium is 133, potassium 3.6, chloride of 104, bicarbonate 24, BUN of 17, creatinine 1.27, glucose 108.     Sol Blazing, PA   ______________________________ Salvatore Decent. Cornelius Moras, M.D.KMD/MEDQ  D:  03/08/2010  T:  03/08/2010  Job:  [id number]  Electronically Signed by Cameron Proud PA on 03/13/2010 10:50:32 AM Electronically Signed by Tressie Stalker M.D. on 03/15/2010 06:49:00 AM

## 2010-03-18 ENCOUNTER — Ambulatory Visit
Admission: RE | Admit: 2010-03-18 | Discharge: 2010-03-18 | Disposition: A | Discharge status: Home / Self Care | Payer: 59 | Source: Ambulatory Visit | Attending: Thoracic Surgery (Cardiothoracic Vascular Surgery) | Admitting: Thoracic Surgery (Cardiothoracic Vascular Surgery)

## 2010-03-18 ENCOUNTER — Encounter (INDEPENDENT_AMBULATORY_CARE_PROVIDER_SITE_OTHER): Payer: Self-pay

## 2010-03-18 ENCOUNTER — Encounter: Payer: Self-pay | Admitting: Thoracic Surgery (Cardiothoracic Vascular Surgery)

## 2010-03-18 DIAGNOSIS — Q211 Atrial septal defect: Secondary | ICD-10-CM

## 2010-03-18 DIAGNOSIS — Q2111 Secundum atrial septal defect: Secondary | ICD-10-CM

## 2010-03-19 NOTE — Assessment & Plan Note (Signed)
OFFICE VISIT  MEILI, KLECKLEY DOB:  November 19, 1950                                        March 18, 2010 CHART #:  [id number]  HISTORY:  The patient is a 60 year old white female who is status post right miniature thoracotomy for patch closure of atrial septal defect and Cox CryoMaze procedure (full bilateral lesion sent) done on March 05, 2010, by Dr. Cornelius Moras for a secundum-type atrial septal defect and also for persistent atrial fibrillation.  She is seen on today's date in routine office visit followup.  Currently, she reports that she is feeling very well.  She is not having any significant difficulty with shortness of breath.  She feels as though her energy level is improving. She does not feel any palpitations but does feel as though her heart may be rapid.  She is not dizzy.  She has had no fevers, chills, or other constitutional symptoms.  IMAGING:  Chest x-ray was obtained on today's date.  It does reveal some basilar atelectasis on the right side.  PHYSICAL EXAMINATION:  Vital Signs:  Blood pressure 131/80, pulse is 120, respirations 18, oxygen saturation is 97% on room air.  General Appearance:  A well-developed adult female in no acute distress. Pulmonary:  Clear lungs bilaterally.  Cardiac:  Irregular rate and rhythm, tachycardic, and this was verified on EKG as in atrial fibrillation with rapid ventricular response at approximately 120. Incisions are inspected and healing well without evidence of infection. I did remove chest tube sutures from two sites.  Extremities:  No edema.  ASSESSMENT:  The patient is doing well following the repair of her atrial septal defect and maze procedure.  She is, however, in atrial fibrillation with a rapid ventricular response.  She was on flecainide preoperatively, but this was not restarted during the postoperative period or at discharge.  She apparently has not been taking her beta- blocker either.  I  discussed this matter with Dr. Cornelius Moras.  He feels as though as long as she is asymptomatic from a clinical viewpoint, she does not require hospitalization at this time.  He did wish to start her on amiodarone 400 mg p.o. b.i.d. and she will be also instructed to resume her atenolol 50 mg p.o. daily.  We will see her in the office in 1 week with a repeat chest x-ray at that time.  She is also instructed to make an appointment to follow up with Dr. Reuel Boom in Cardiology and to continue her Coumadin and get that checked.  She did have an INR drawn on today's date, but I do not have that result.  This was done at Dr. Rosann Auerbach office.  He will be managing her Coumadin.  Also, if she has any difficulties in the interim, she is instructed to contact our office.  Rowe Clack, P.A.-C.  Sherryll Burger  D:  03/18/2010  T:  03/19/2010  Job:  [id number]  cc:   Lanell Matar, DO Morene Antu, MD

## 2010-03-25 ENCOUNTER — Ambulatory Visit (INDEPENDENT_AMBULATORY_CARE_PROVIDER_SITE_OTHER): Payer: Self-pay | Admitting: Thoracic Surgery (Cardiothoracic Vascular Surgery)

## 2010-03-25 DIAGNOSIS — Q211 Atrial septal defect: Secondary | ICD-10-CM

## 2010-03-25 DIAGNOSIS — I4891 Unspecified atrial fibrillation: Secondary | ICD-10-CM

## 2010-03-26 NOTE — Assessment & Plan Note (Signed)
OFFICE VISIT  Briana, Moran DOB:  24-Dec-1950                                        March 25, 2010 CHART #:  [id number]  HISTORY OF PRESENT ILLNESS:  The patient returns for further followup status post right minithoracotomy for patch closure of atrial septal defect with Cox-CryoMaze procedure on March 05, 2010.  Her early postoperative recovery was complicated by the fact that she had a retained fragment of a Swan-Ganz pulmonary artery catheter that had to be removed by Interventional Radiology.  She otherwise did remarkably well.  She was seen in followup in the office last week, at which time she was noted to be in atrial fibrillation with an elevated ventricular response.  She was restarted on her beta-blocker and oral amiodarone. Since then she has done well and she returns to our office for routine followup today.  She denies any fevers, chills, or productive cough. She has not had any shortness of breath.  She has minimal residual soreness in her chest, although she has had some pain in the left shoulder and left arm in the last couple of days.  Because of this, she is still using the oral narcotic pain relievers occasionally.  She has not had any dizzy spells or tachypalpitations.  Her appetite is good. She is sleeping well at night.  Her prothrombin time was checked last week and she planned to get it checked again later today at [facility] in [facility]  The remainder of her review of systems is unremarkable.  PHYSICAL EXAMINATION:  A well-appearing female, blood pressure 120/80, pulse is 80 and irregular.  Two-channel telemetry rhythm strip demonstrates what appears to be atrial fibrillation with controlled ventricular response rate, oxygen saturation 95% on room air. Examination of the chest reveals a minithoracotomy incision that is healing nicely.  Breath sounds are clear to auscultation and  symmetrical bilaterally.  Cardiovascular exam is notable for irregular heart rhythm. No murmurs, rubs, or gallops are noted.  The abdomen is soft and nontender.  The right groin incision is healing nicely.  There is no lower extremity edema.  The remainder of physical exam is unremarkable.  IMPRESSION:  Satisfactory progress following recent closure of atrial septal defect with Maze procedure.  The patient does appear to be in atrial fibrillation with a controlled ventricular response rate.  She is reportedly therapeutic on Coumadin and she otherwise looks quite good.  PLAN:  We will continue without making any changes at this time.  I have encouraged the patient to continue to gradually increase her physical activity.  Once she gets to the point where she no longer needs any sort of pain relievers during the daytime, I think it would be reasonable for her to resume driving an automobile.  We will plan to see her back in the office next week with a followup chest x-ray.  All of her questions have been addressed.  Salvatore Decent. Cornelius Moras, M.D. Electronically Signed  CHO/MEDQ  D:  03/25/2010  T:  03/26/2010  Job:  [id number]  cc:   Lanell Matar, DO Dr. Wyn Forster

## 2010-03-29 ENCOUNTER — Other Ambulatory Visit: Payer: Self-pay | Admitting: Thoracic Surgery (Cardiothoracic Vascular Surgery)

## 2010-03-29 DIAGNOSIS — C059 Malignant neoplasm of palate, unspecified: Secondary | ICD-10-CM

## 2010-04-01 ENCOUNTER — Ambulatory Visit
Admission: RE | Admit: 2010-04-01 | Discharge: 2010-04-01 | Disposition: A | Discharge status: Home / Self Care | Payer: 59 | Source: Ambulatory Visit | Attending: Thoracic Surgery (Cardiothoracic Vascular Surgery) | Admitting: Thoracic Surgery (Cardiothoracic Vascular Surgery)

## 2010-04-01 ENCOUNTER — Encounter (INDEPENDENT_AMBULATORY_CARE_PROVIDER_SITE_OTHER): Payer: Self-pay | Admitting: Thoracic Surgery (Cardiothoracic Vascular Surgery)

## 2010-04-01 DIAGNOSIS — I4891 Unspecified atrial fibrillation: Secondary | ICD-10-CM

## 2010-04-01 DIAGNOSIS — Q211 Atrial septal defect: Secondary | ICD-10-CM

## 2010-04-01 DIAGNOSIS — C059 Malignant neoplasm of palate, unspecified: Secondary | ICD-10-CM

## 2010-04-01 NOTE — Assessment & Plan Note (Signed)
OFFICE VISIT  ADESSA, PRIMIANO DOB:  [birth year]                                        April 01, 2010 CHART #:  [id number]  HISTORY OF PRESENT ILLNESS:  The patient returns for routine followup status post right miniature thoracotomy for closure of atrial septal defect and Cox-Cryo maze procedure on March 05, 2010.  She was last seen here in the office on March 5.  Over the last week, she has done quite nicely and enjoyed considerable improvement.  She now has no longer any significant pain in her chest.  Her only pain is that related to her shoulder and neck which really only seems to bother her when she is sleeping at night.  During the day she has been up and around and she has gone back to driving an automobile.  She has no shortness of breath. She states that she does still get tired with activity.  She has not had any dizzy spells or tachy palpitations.  She is eating well.  The remainder of her review of systems is unremarkable.  PHYSICAL EXAMINATION:  GENERAL:  A well-appearing female.  VITAL SIGNS: Blood pressure 127/86, pulse 97 and regular, and oxygen saturation 98% on room air.  CHEST:  Mini thoracotomy incision that is healed nicely. Auscultation reveals clear breath sounds that are symmetrical. CARDIOVASCULAR:  Notable for regular rate and rhythm.  No murmurs, rubs, or gallops are noted.  ABDOMEN:  Soft and nontender.  EXTREMITIES:  Warm and well perfused.  There is no lower extremity edema.  DIAGNOSTIC TEST:  Chest x-ray performed today at the [facility] is reviewed.  This demonstrates further clearing of the lung fields with no significant pleural effusions and trivial residual patchy atelectasis on the right side.  Overall, her x-ray looks quite well.  IMPRESSION:  Excellent progress following mini thoracotomy for closure of atrial septal defect with maze procedure.  The patient is quite stable.  She is reportedly  therapeutic on Coumadin.  PLAN:  I have instructed the patient to cut her dose of amiodarone to 200 mg p.o. twice daily.  We have otherwise not made any changes in her medications.  I have encouraged her to go ahead and get involved in the cardiac rehab program.  All of her questions have been addressed.  We will plan to see her back in 2 months' time for followup and rhythm check.  Salvatore Decent. Cornelius Moras, M.D. Electronically Signed  CHO/MEDQ  D:  04/01/2010  T:  04/01/2010  Job:  [id number]  cc:   Lanell Matar, DO Wyn Forster, MD

## 2010-04-17 ENCOUNTER — Encounter (INDEPENDENT_AMBULATORY_CARE_PROVIDER_SITE_OTHER): Payer: 59

## 2010-04-17 DIAGNOSIS — Q211 Atrial septal defect: Secondary | ICD-10-CM

## 2010-04-18 NOTE — Assessment & Plan Note (Signed)
[place] OFFICE VISIT  Briana Moran, Briana Moran DOB:  1950-06-24                                        April 17, 2010 CHART #:  [id number]  HISTORY OF PRESENT ILLNESS:  The patient returns for followup status post right mini thoracotomy for closure of atrial septal defect and maze procedure on March 05, 2010.  She was last seen in the office in [place] on April 01, 2010.  Since then, she has continued to do well. However, she developed some swelling in the right groin.  She was seen in the office by Dr. Tollie Pizza in [facility].  An ultrasound was performed confirming a fluid collection consistent with lymphocele.  The patient presents to the office today.  She states that she is doing quite well.  She still has some mild exertional shortness of breath. She is not having any pain.  Overall, she is getting around fairly well. She has no tachy palpitations or dizzy spells, although she was noted to remain in atrial fibrillation the last time she was seen in the office by Dr. Reuel Boom.  She remains on amiodarone and Coumadin.  She has no other complaints.  PHYSICAL EXAMINATION:  General:  Notable for well-appearing female. Vital Signs:  Pulse is 90 and irregular.  Chest:  Auscultation of the chest reveals clear breath sounds that are symmetrical bilaterally.  Her mini thoracotomy incision is healed nicely.  Extremities:  The right groin incision is healed nicely, but she does have a soft fluctuant area in the right groin which is nontender on palpation and consistent with lymphocele.  She does not have lower extremity edema.  The remainder of her physical exam is unremarkable.  The right groin is painted with Betadine solution and anesthetized with 1% lidocaine.  An 18-gauge needle was then utilized to aspirate the lymphocele.  A total 42 mL of clear thin serous fluid was easily evacuated.  Aspiration was tolerated quite well.  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  The patient remains in atrial fibrillation with controlled ventricular response rate.  Clinically, she is doing well, otherwise.  PLAN:  I would encourage consideration of elective direct current cardioversion if the patient does not spontaneously convert to sinus rhythm at some point within the next month.  We will plan to see her back in the office in 6 weeks.  She has been advised that there is definitely a possibility that her lymphocele could reaccumulate, and if such she will call our office and return for further follow-up as occasionally repeat aspiration is necessary to completely eradicate this problem.  All of her questions have been addressed.  Salvatore Decent. Cornelius Moras, M.D. Electronically Signed  CHO/MEDQ  D:  04/17/2010  T:  04/18/2010  Job:  [id number]  cc:   Lanell Matar, DO Dr. Tiburcio Pea

## 2010-04-25 NOTE — Op Note (Signed)
Briana Moran, Briana Moran NO.:  [id number]  MEDICAL RECORD NO.:  [id number]           PATIENT TYPE:  I  LOCATION:  2308                         FACILITY:  [facility]  PHYSICIAN:  Briana Moran, M.D.  DATE OF BIRTH:  May 05, 1950  DATE OF PROCEDURE:  03/05/2010 DATE OF DISCHARGE:                              OPERATIVE REPORT   PROCEDURE:  Intraoperative transesophageal echocardiography.  [name] is a 60 year old white female, who has been noted to have an atrial septal defect and has developed resting shortness of breath, orthopnea, lower extremity edema, and atrial fibrillation.  She is now scheduled to undergo atrial septal defect repair and possible mitral valve repair by Dr. Cornelius Moran as well as maze procedure. Intraoperative transesophageal echocardiography was requested to evaluate the atrial septal defect and the aortic valve and determine the suitability of repair of the aortic valve and to assist with closure of the atrial septal defect and to serve as a monitor for intraoperative volume status.  The patient was brought to the operating room at [facility] and general anesthesia was induced without difficulty.  The trachea was intubated without difficulty.  Following orogastric suctioning, the transesophageal echocardiography probe was then inserted into the esophagus without difficulty.  IMPRESSION:  PREBYPASS FINDINGS: 1. Aortic valve:  The aortic valve was trileaflet.  The leaflets     opened normally and there was no aortic insufficiency.  There was     no calcification in the leaflets. 2. Mitral valve:  The mitral leaflets coapted with some degree of     apical displacement of the coaptation point.  There was a central     jet of mitral regurgitation, which was read as mild.  There were no     prolapsed or fluttering segments of the leaflets.  The leaflets     were thin and pliable.  There was no significant annular     calcification  noted. 3. Left ventricle:  The left ventricular size appeared to be within     normal limits.  There was good contractility in all segments     interrogated and ejection fraction was estimated at 65%.  Left     ventricular end-diastolic diameter measured 4.9 cm in the mid     papillary level.  Left ventricular end-systolic diameter measured     3.8 cm at the mid papillary level.  Left ventricular wall thickness     measured 0.86-0.95 cm at end diastole at the mid papillary level. 4. Right ventricle:  The right ventricular cavity was markedly     enlarged and appeared to occupy twice the area of the left     ventricle in the four-chamber mid esophageal view.  There was     inward contractility of the right ventricular free wall, which was     judged to be mildly depressed.  The interatrial septum was not     flattened and was concaved to the left ventricle. 5. Tricuspid valve:  The tricuspid annulus was moderately enlarged,     and there was 1-2+ tricuspid insufficiency.  The leaflets appeared  structurally normal.  The tricuspid annulus measured 3.3 cm in the     four-chamber view and 4.5 cm in diameter in the RV inflow and     outflow view. 6. Atrial septum:  There was a large secundum-type atrial septal     defect present.  This measured 3.16 cm in the four-chamber view and     at 90 degrees measured 2.79 cm.  There appeared to be left-to-right     flow on the color Doppler.  There was marked thinning of the     interatrial septum in the remainder that was present, and there     appeared to be no rim tissue in the ventral area of the defect     along the anterior wall of the right and left atria in the region     of the aorta.  This was confirmed with multiple views using the 3-D     echo as well. 7. Right atrium:  The right atrial cavity was markedly enlarged and     measured 7.50 cm in diameter in the four-chamber view. 8. Left atrium:  The left atrial cavity was enlarged and  measured 5.42     cm in the mid esophageal aortic valve short axis view.  There was     no thrombus noted in the left atrium or left atrial appendage.     This was confirmed by 3-D interrogation of the left atrial     appendage. 9. Ascending aorta:  The ascending aorta appeared free of atheromatous     disease.  There was a well-defined aortic root in sinotubular     ridge, which was not aneurysmal or ectatic. 10.Descending aorta:  The descending aorta appeared free of     atheromatous disease and measured 2.86 cm.  POSTBYPASS FINDINGS: 1. Aortic valve:  The aortic valve was unchanged from the prebypass     study.  There was no aortic insufficiency and the leaflets opened     normally. 2. Mitral valve:  The mitral valve opened normally, and there was     trace mitral insufficiency noted. 3. Left ventricle:  The left ventricular cavity again appeared normal     within normal limits of size and contracted well with an ejection     fraction estimated at 65%. 4. Right ventricle:  The right ventricular size cavity appeared     somewhat decreased in size. Again, there was some mildly decreased     contractility of the right ventricular free wall, but there was no     septal flattening. 5. Tricuspid valve:  The tricuspid valve showed 1+ tricuspid     insufficiency. 6. Interatrial septum:  The interatrial septum was interrogated very     carefully, and there was no evidence of residual atrial septal     defect.  The interatrial patch could be appreciated with multiple     views when scanning and 3-D interrogation.  No residual atrial     septal defect could be appreciated. 7. Left atrium:  The left atrial size appeared somewhat decreased, and     there was no thrombus noted in the left atrium or left atrial     appendage again by 2-D study.          ______________________________ Briana Moran, M.D.     DCJ/MEDQ  D:  03/05/2010  T:  03/06/2010  Job:  [id number]  Electronically  Signed by Briana Moran M.D. on 04/25/2010 08:59:44 AM

## 2010-05-27 ENCOUNTER — Ambulatory Visit (INDEPENDENT_AMBULATORY_CARE_PROVIDER_SITE_OTHER): Payer: Self-pay | Admitting: Thoracic Surgery (Cardiothoracic Vascular Surgery)

## 2010-05-27 DIAGNOSIS — Q211 Atrial septal defect: Secondary | ICD-10-CM

## 2010-05-27 DIAGNOSIS — I4891 Unspecified atrial fibrillation: Secondary | ICD-10-CM

## 2010-05-28 NOTE — Assessment & Plan Note (Signed)
OFFICE VISIT  Briana Moran, Briana Moran DOB:  11-08-1950                                        May 27, 2010 CHART #:  [id number]  HISTORY OF PRESENT ILLNESS:  The patient returns for followup status post right minithoracotomy for closure of atrial septal defect and Cox- CryoMaze procedure on March 05, 2010.  She was last seen in the office on April 01, 2010.  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  She had undergone needle aspiration in the office in [facility], and tolerated this well, but she said that within a couple of weeks, the swelling had reaccumulated.  She otherwise feels fine.  She has no shortness of breath.  She has no tachypalpitations.  The remainder of her review of systems is unremarkable.  PHYSICAL EXAMINATION:  A well-appearing female with blood pressure 159/93, pulse 82 and regular.  Two-channel telemetry rhythm strip demonstrates what appears to be sinus rhythm.  Auscultation of the chest reveals clear breath sounds that are symmetrical bilaterally.  No wheezes, rales, or rhonchi are noted.  The minithoracotomy incision has healed nicely.  The right groin incision has healed well, but she has reaccumulated soft tissue swelling in the groin consistent with lymphocele.  No other abnormalities are noted.  Her right groin incision is prepared with Betadine solution and anesthetized with a small amount of 1% lidocaine solution.  The groin lymphocele is now aspirated with an 18-gauge needle.  Total of 58 mL of thin serous fluid was evacuated uneventfully.  IMPRESSION:  Stable progress following minithoracotomy for closure of atrial septal defect and maze procedure.  Overall, the patient seems to be doing well, but she has developed a recurrent right groin lymphocele which we aspirated in the office today.  PLAN:  We will have the patient return on a weekly basis as repeat needle  aspiration may be required to make her lymphocele completely go away.  The rationale for this approach has been discussed and all of her questions have been addressed.  Salvatore Decent. Cornelius Moras, M.D. Electronically Signed  CHO/MEDQ  D:  05/27/2010  T:  05/28/2010  Job:  [id number]  cc:   Lanell Matar, DO Dr. Wyn Forster

## 2010-06-03 ENCOUNTER — Encounter (INDEPENDENT_AMBULATORY_CARE_PROVIDER_SITE_OTHER): Payer: Self-pay | Admitting: Thoracic Surgery (Cardiothoracic Vascular Surgery)

## 2010-06-03 DIAGNOSIS — Q211 Atrial septal defect: Secondary | ICD-10-CM

## 2010-06-03 DIAGNOSIS — I898 Other specified noninfective disorders of lymphatic vessels and lymph nodes: Secondary | ICD-10-CM

## 2010-06-03 DIAGNOSIS — I4891 Unspecified atrial fibrillation: Secondary | ICD-10-CM

## 2010-06-03 NOTE — Assessment & Plan Note (Signed)
OFFICE VISIT  [name], [name] DOB:  Jan 24, 1950                                        Jun 03, 2010 CHART #:  [id number]  The patient returns for followup of her right groin lymphocele.  She was last seen here in the office last week.  Since then she has remained quite stable and overall feels well.  Her lymphocele did recur, although it is not as big as it was before.  The right groin was prepared with Betadine solution and anesthetized with 3 mL of 1% lidocaine administered into the subcutaneous tissues.  The right groin lymphocele was aspirated using the 22-gauge needle.  A total of 45 mL of thin serous fluid was evacuated uneventfully.  Procedure was tolerated well. The patient will return in 1 week for further followup and repeat groin check.  Salvatore Decent. Cornelius Moras, M.D. Electronically Signed  CHO/MEDQ  D:  06/03/2010  T:  06/03/2010  Job:  [id number]

## 2010-06-04 NOTE — Consult Note (Signed)
NEW PATIENT CONSULTATION   Briana, Moran  DOB:  09-10-1950                                        August 20, 2009  CHART #:  [id number]   PRIMARY CARE PHYSICIAN:  Dr. Morene Antu.   REASON FOR CONSULTATION:  Atrial septal defect.   HISTORY OF PRESENT ILLNESS:  The patient is a 60 year old married white  female from [place], [place], who was first discovered to have  an atrial septal defect approximately 6 or 7 years ago.  She was noted  to have a heart murmur on physical exam by her primary care physician,  and transthoracic echocardiogram confirmed the presence of what appeared  to be a secundum-type atrial septal defect.  The patient was advised to  have this looked into further at the time, but she elected not to.  She  has not been seen in followup for quite some time until approximately 1  year ago when she suffered a fall injuring both wrists.  This required  surgical intervention, and this brought her cardiac issues back to  light.  She has been seen in followup since then by Dr. Tollie Pizza at  [facility] in [facility].  She developed persistent  atrial fibrillation, for which she was started on Coumadin and treated  with flecainide.  She has since then converted back to sinus rhythm, and  Coumadin therapy has been discontinued.  She underwent transesophageal  echocardiogram to further evaluate her atrial septal defect on Jun 11, 2009.  This confirmed the presence of a large secundum-type atrial  septal defect with right ventricular and right atrial chamber  enlargement and mild pulmonary hypertension.  There appeared to be  normal appearing pulmonary venous connections.  There is mild mitral  regurgitation and mild tricuspid regurgitation.  Left ventricular  systolic function was normal.  The atrial septal defect was carefully  evaluated with respect to whether or not it could be closed  percutaneously.  Because of it's  very large size and the fact that there  is not a complete rim of septal tissue, closure with percutaneous device  is felt not to be feasible.  The patient has been referred to consider  minimally invasive surgical approach.  She has not yet had cardiac  catheterization performed.   REVIEW OF SYSTEMS:  GENERAL:  The patient reports normal appetite.  She  has not been gaining nor losing weight recently.  She is 5 feet 4 inches  tall and weighs approximately 135 pounds.  CARDIAC:  The patient denies any chest pain, chest tightness, or chest  pressure either with activity or at rest.  She denies resting shortness  of breath, PND, orthopnea, or lower extremity edema.  She does admit to  mild exertional shortness of breath if she pushes herself, but she  states that this does not limit her at all physically and symptoms have  been stable for quite some time.  She denies any tachy palpitations or  dizzy spells.  RESPIRATORY:  Notable for the absence of any persistent cough,  hemoptysis, wheezing.  GASTROINTESTINAL:  Negative.  The patient has been told that she had GE  reflux.  She has no present difficulty swallowing and she did not have  trouble with transesophageal echocardiogram.  Bowel function is normal.  She denies hematochezia, hematemesis, or  melena.  GENITOURINARY:  Negative.  PERIPHERAL VASCULAR:  Negative.  NEUROLOGIC:  Negative.  The patient denies transient monocular blindness  or transient numbness or weakness involving either upper or lower  extremity.  She has not had problems with migraine headaches.  MUSCULOSKELETAL:  Negative.  PSYCHIATRIC:  Negative.  HEENT:  Negative.   PAST MEDICAL HISTORY:  1. Atrial septal defect.  2. Atrial fibrillation.  3. Hypertension.   PAST SURGICAL HISTORY:  Bilateral wrist surgery approximately 1 year  ago.   FAMILY HISTORY:  Noncontributory.   SOCIAL HISTORY:  The patient is married and lives with her husband in  [place].  She  works in IT sales professional for [employer].  She has a previous history of tobacco use, but she quit smoking  completely in 1995.  She denies excessive alcohol consumption.   CURRENT MEDICATIONS:  1. Atenolol 50 mg daily.  2. Flecainide 50 mg twice daily.  3. Triamterene 25 mg daily.  4. Aspirin 325 mg twice daily.  5. Calcium with vitamin D supplement twice daily.   DRUG ALLERGIES:  None known.   PHYSICAL EXAMINATION:  General:  The patient is a well-appearing female,  who appears her stated age in no acute distress.  Vital Signs:  Blood  pressure is 125/85, pulse 70 and regular, and oxygen saturation 95% on  room air.  HEENT:  Unrevealing.  Neck:  Supple.  There are no carotid  bruits.  There is no palpable cervical nor supraclavicular  lymphadenopathy.  There is no jugular venous distention.  No carotid  bruits are noted.  Chest:  Auscultation of the chest reveals clear  breath sounds that are symmetrical bilaterally.  No wheezes, rales, or  rhonchi are noted.  Cardiovascular:  Notable for regular rate and  rhythm.  There is a grade 2/6 systolic murmur heard best along the left  upper sternal border.  No diastolic murmurs are noted.  Abdomen:  Soft,  nondistended, and nontender.  Bowel sounds are present.  Extremities:  Warm and well perfused.  There is no lower extremity edema.  There are  mild changes of varicose veins in both lower legs, but no significant  venous insufficiency.  Distal pulses are easily palpable and intact with  strong femoral pulses bilaterally and palpable pulses in the posterior  tibial position bilaterally.  Rectal and GU:  Both deferred.  Neurologic:  Grossly nonfocal and symmetrical throughout.   DIAGNOSTIC TESTS:  Transesophageal echocardiogram performed, Jun 11, 2009, by Dr. Reuel Boom at [facility] is reviewed.  This confirms the presence of a very large atrial septal defect that  appears to be consistent with a  secundum-type atrial septal defect,  although there is incomplete rim of septal tissue.  There does not  appear to be any anomalous pulmonary venous anatomy as far as well can  be appreciated on this exam.  There is mild mitral regurgitation that  consists of a central jet regurgitation due to volume overload with mild  annular enlargement.  The functional anatomy of the mitral valve is  entirely normal.  There is significant right ventricular and right  atrial chamber enlargement with severe right atrial enlargement.  There  is mild tricuspid regurgitation.  The aortic valve appears normal.  Left  ventricular systolic function appears normal.  No other abnormalities  are noted.   IMPRESSION:  Large secundum-type atrial septal defect that does not  appear to be amenable to percutaneous closure techniques.  I agree that  the patient would best be treated with surgical closure.  With recent  history of persistent atrial fibrillation, concomitant maze procedure  might be considered as well.  She needs left and right heart  catheterization to assess her pulmonary artery pressures, shunt  fraction, and make certain that she does not have any significant  coronary artery disease.  In the absence of significant coronary artery  disease, I agree that she would be an excellent candidate for minimally  invasive approach.   PLAN:  I have discussed matters at length with the patient and her  husband here in the office today.  The indications, risks, and benefits  of surgical closure have been discussed in detail and all their  questions have been addressed.  The patient still wants to think things  over and is not in any hurry to get anything done, but she seems to  understand that it would be in her best interest to get her atrial  septal defect closed sooner rather than later.  I have advised her to go  ahead and contact Dr. Reuel Boom as soon as practical to arrange for  catheterization.  Once this  has been  completed, we can make more definitive arrangements for surgery whenever  she likes.  All of her questions have been addressed.   Salvatore Decent. Cornelius Moras, M.D.  Electronically Signed   CHO/MEDQ  D:  08/20/2009  T:  08/21/2009  Job:  [id number]   cc:   Lanell Matar, DO  Dr. Morene Antu

## 2010-06-10 ENCOUNTER — Encounter (INDEPENDENT_AMBULATORY_CARE_PROVIDER_SITE_OTHER): Payer: 59 | Admitting: Thoracic Surgery (Cardiothoracic Vascular Surgery)

## 2010-06-10 DIAGNOSIS — Q211 Atrial septal defect: Secondary | ICD-10-CM

## 2010-06-10 DIAGNOSIS — I4891 Unspecified atrial fibrillation: Secondary | ICD-10-CM

## 2010-06-11 NOTE — Assessment & Plan Note (Signed)
OFFICE VISIT  SHAHD, OCCHIPINTI DOB:  05-Sep-1950                                        Jun 10, 2010 CHART #:  [id number]  Ms. Barrientez returns for groin check today.  Her lymphocele has recurred, but again not as big as it was last week.  She is otherwise doing well except to the fact that this past weekend she got a fair amount of sun.  On amiodarone, this has caused significant sensitivity with sunburn.  We aspirated her groin again today and got a total of 41 mL of thin serous fluid.  It was tolerated well.  We discussed how important it will be for her to continue to try to stay out of the sun as much as possible where high-power sunscreen or preferably clothing to keep her covered while she remains on amiodarone.  We will plan to see her back in 2 weeks for another groin check.  Salvatore Decent. Cornelius Moras, M.D. Electronically Signed  CHO/MEDQ  D:  06/10/2010  T:  06/11/2010  Job:  [id number]

## 2010-06-24 ENCOUNTER — Encounter (INDEPENDENT_AMBULATORY_CARE_PROVIDER_SITE_OTHER): Payer: 59 | Admitting: Thoracic Surgery (Cardiothoracic Vascular Surgery)

## 2010-06-24 DIAGNOSIS — I898 Other specified noninfective disorders of lymphatic vessels and lymph nodes: Secondary | ICD-10-CM

## 2010-06-24 DIAGNOSIS — Q211 Atrial septal defect: Secondary | ICD-10-CM

## 2010-06-25 NOTE — Assessment & Plan Note (Signed)
OFFICE VISIT  Briana Moran, Briana Moran DOB:  1950-11-07                                        June 24, 2010 CHART #:  [id number]  The patient returns for groin check today.  It has been 2 weeks since she last had it aspirated.  Her lymphocele has recurred.  We aspirated again in the office today and got a total of 53 mL of thin serous fluid. We will see her again in 2 weeks or sooner if the fluid accumulation seems to recur later this week.  We have also talked about the option of possible sclerosis.  All of her questions have been addressed.  Salvatore Decent. Cornelius Moras, M.D. Electronically Signed  CHO/MEDQ  D:  06/24/2010  T:  06/25/2010  Job:  [id number]

## 2010-06-28 ENCOUNTER — Encounter (INDEPENDENT_AMBULATORY_CARE_PROVIDER_SITE_OTHER): Payer: 59 | Admitting: Thoracic Surgery (Cardiothoracic Vascular Surgery)

## 2010-06-28 DIAGNOSIS — I898 Other specified noninfective disorders of lymphatic vessels and lymph nodes: Secondary | ICD-10-CM

## 2010-06-29 NOTE — Assessment & Plan Note (Signed)
OFFICE VISIT  Briana Moran, Briana Moran DOB:  1950/12/30                                        June 28, 2010 CHART #:  [id number]  The patient returns for groin check today.  We again aspirated her right groin lymphocele and got 45 mL of thin serous fluid.  We discussed continued weekly aspirations versus an attempt at sclerosis.  For the time being, we will continue to see her back every week and re-aspirate her lymphocele until hopefully it will completely resolve.  Salvatore Decent. Cornelius Moras, M.D. Electronically Signed  CHO/MEDQ  D:  06/28/2010  T:  06/29/2010  Job:  [id number]

## 2010-07-08 ENCOUNTER — Encounter: Payer: 59 | Admitting: Thoracic Surgery (Cardiothoracic Vascular Surgery)

## 2010-07-08 ENCOUNTER — Encounter (INDEPENDENT_AMBULATORY_CARE_PROVIDER_SITE_OTHER): Payer: 59 | Admitting: Thoracic Surgery (Cardiothoracic Vascular Surgery)

## 2010-07-08 DIAGNOSIS — I898 Other specified noninfective disorders of lymphatic vessels and lymph nodes: Secondary | ICD-10-CM

## 2010-07-09 NOTE — Assessment & Plan Note (Signed)
OFFICE VISIT  Briana Moran, Briana Moran DOB:  1950-05-31                                        July 08, 2010 CHART #:  [id number]  The patient returns to the office today for groin check.  She has done well over the past week.  She reports no new complaints.  On physical exam, her lymphocele continues to recur, but it seems to be getting smaller and there seems to be more firmness around it.  There is no tenderness or surrounding erythema.  Following preparation, the lymphocele was aspirated and a total of 44 liters of thin serous fluid was evacuated uneventfully.  The procedure was tolerated well.  She will return in one week's time.  Salvatore Decent. Cornelius Moras, M.D. Electronically Signed  CHO/MEDQ  D:  07/08/2010  T:  07/09/2010  Job:  [id number]

## 2010-07-15 ENCOUNTER — Encounter (INDEPENDENT_AMBULATORY_CARE_PROVIDER_SITE_OTHER): Payer: 59 | Admitting: Thoracic Surgery (Cardiothoracic Vascular Surgery)

## 2010-07-15 DIAGNOSIS — I898 Other specified noninfective disorders of lymphatic vessels and lymph nodes: Secondary | ICD-10-CM

## 2010-07-16 NOTE — Assessment & Plan Note (Signed)
OFFICE VISIT  LINNET, BOTTARI DOB:  10/07/1950                                        July 15, 2010 CHART #:  [id number]  The patient returns for groin check today.  Her lymphocele was aspirated again, yielding approximately 15 mL of thin serosanguineous fluid.  She will return again for groin check in 2 weeks.  She is otherwise doing quite well.  Salvatore Decent. Cornelius Moras, M.D. Electronically Signed  CHO/MEDQ  D:  07/15/2010  T:  07/16/2010  Job:  [id number]

## 2010-07-29 ENCOUNTER — Encounter: Payer: 59 | Admitting: Thoracic Surgery (Cardiothoracic Vascular Surgery)

## 2010-07-29 ENCOUNTER — Ambulatory Visit (INDEPENDENT_AMBULATORY_CARE_PROVIDER_SITE_OTHER): Payer: 59 | Admitting: Thoracic Surgery (Cardiothoracic Vascular Surgery)

## 2010-07-29 ENCOUNTER — Encounter: Payer: Self-pay | Admitting: Thoracic Surgery (Cardiothoracic Vascular Surgery)

## 2010-07-29 VITALS — BP 137/82 | HR 68 | Resp 18

## 2010-07-29 DIAGNOSIS — Z9889 Other specified postprocedural states: Secondary | ICD-10-CM

## 2010-07-29 DIAGNOSIS — Z8774 Personal history of (corrected) congenital malformations of heart and circulatory system: Secondary | ICD-10-CM

## 2010-07-29 DIAGNOSIS — I898 Other specified noninfective disorders of lymphatic vessels and lymph nodes: Secondary | ICD-10-CM

## 2010-07-29 DIAGNOSIS — I4891 Unspecified atrial fibrillation: Secondary | ICD-10-CM

## 2010-07-29 NOTE — Assessment & Plan Note (Signed)
OFFICE VISIT  CAILIN, GEBEL DOB:  [birth year]                                        July 29, 2010 CHART #:  [id number]  The patient returns for repeat groin check with recurrent right groin lymphocele.  She was last seen here in the office on July 15, 2010. Since then her lymphocele has again reaccumulated.  We again aspirated her lymphocele in the office today, yielding 42 mL of thin serous fluid. We discussed further options including continued repeat aspiration versus open surgical repair versus an attempt at aspiration with fibrin glue injection in the operating room.  After considerable discussion, the patient desires to proceed with an attempt at fibrin glue injection. We will plan to do this next week on Monday, August 05, 2010.  She understands that there is a small risk that fibrin glue injection could in fact result in the introduction of infection and the possible need for open surgical intervention.  All of her questions have been addressed.  Salvatore Decent. Cornelius Moras, M.D. Electronically Signed  CHO/MEDQ  D:  07/29/2010  T:  07/29/2010  Job:  [id number]

## 2010-08-05 ENCOUNTER — Ambulatory Visit (HOSPITAL_COMMUNITY)
Admission: RE | Admit: 2010-08-05 | Discharge: 2010-08-05 | Disposition: A | Discharge status: Home / Self Care | Payer: 59 | Source: Ambulatory Visit | Attending: Thoracic Surgery (Cardiothoracic Vascular Surgery) | Admitting: Thoracic Surgery (Cardiothoracic Vascular Surgery)

## 2010-08-05 DIAGNOSIS — I4891 Unspecified atrial fibrillation: Secondary | ICD-10-CM | POA: Insufficient documentation

## 2010-08-05 DIAGNOSIS — I10 Essential (primary) hypertension: Secondary | ICD-10-CM | POA: Insufficient documentation

## 2010-08-05 DIAGNOSIS — I898 Other specified noninfective disorders of lymphatic vessels and lymph nodes: Secondary | ICD-10-CM | POA: Insufficient documentation

## 2010-08-05 DIAGNOSIS — Z01812 Encounter for preprocedural laboratory examination: Secondary | ICD-10-CM | POA: Insufficient documentation

## 2010-08-05 HISTORY — PX: OTHER SURGICAL HISTORY: SHX169

## 2010-08-05 LAB — APTT: aPTT: 37 seconds (ref 24–37)

## 2010-08-05 LAB — BASIC METABOLIC PANEL
CO2: 27 mEq/L (ref 19–32)
Calcium: 9.4 mg/dL (ref 8.4–10.5)
GFR calc non Af Amer: >60 mL/min (ref >60)
Potassium: 3.7 mEq/L (ref 3.5–5.1)
Sodium: 136 mEq/L (ref 135–145)

## 2010-08-05 LAB — CBC
Hemoglobin: 7.5 g/dL — ABNORMAL LOW (ref 12.0–15.0)
MCH: 29.1 pg (ref 26.0–34.0)
Platelets: 218 10*3/uL (ref 150–400)
RBC: 2.58 MIL/uL — ABNORMAL LOW (ref 3.87–5.11)
WBC: 6 10*3/uL (ref 4.0–10.5)

## 2010-08-05 LAB — PROTIME-INR
INR: 1.93 — ABNORMAL HIGH (ref 0.00–1.49)
Prothrombin Time: 22.4 seconds — ABNORMAL HIGH (ref 11.6–15.2)

## 2010-08-05 LAB — SURGICAL PCR SCREEN: Staphylococcus aureus: NEGATIVE

## 2010-08-06 NOTE — Op Note (Signed)
  NAMEKEMYA, SHED NO.:  [id number]  MEDICAL RECORD NO.:  [id number]  LOCATION:  [facility]                         FACILITY:  [facility]  PHYSICIAN:  Salvatore Decent. Cornelius Moras, M.D. DATE OF BIRTH:  August 21, 1950  DATE OF PROCEDURE:  08/05/2010 DATE OF DISCHARGE:                              OPERATIVE REPORT   PREOPERATIVE DIAGNOSIS:  Recurrent right groin lymphocele.  POSTOPERATIVE DIAGNOSIS:  Recurrent right groin lymphocele.  PROCEDURE:  Aspiration and fibrin glue injection of right groin lymphocele.  SURGEON:  Salvatore Decent. Cornelius Moras, MD  ANESTHESIA:  Local.  PROCEDURE NOTE IN DETAIL:  The patient is a 60 year old female who previously underwent right miniature thoracotomy for minimally invasive patch closure of atrial septal defect using right groin cannulation from cardiopulmonary bypass.  The patient subsequently developed a recurrent lymphocele in the right groin which has been aspirated numerous times as an outpatient and continued to recur.  Options have been discussed with the patient and the possibility of fibrin glue injection to definitively treat this recurrent lymphocele has been discussed.  Following full informed consent, the patient was brought to the operating room on the above-mentioned date and a time-out procedure was performed.  The patient's right groin was prepared and draped in sterile manner. SonoSite ultrasound was utilized to visualize large lymphocele in the right groin.  A 1% lidocaine solution was utilized to anesthetize the skin and subcutaneous tissues immediately overlying the lymphocele.  The lymphocele was then aspirated with an 18 gauge needle and a total of 43 mL of thin serous fluid was evacuated.  Complete obliteration of the lymphocele was verified with SonoSite.  A flexible guidewire was passed through the needle and needle tract was then dilated with a Cordis introducing sheath dilator.  The dilator was removed and fibrin glue delivery  system (Evicel) was advanced over the guidewire and the guidewire was removed.  The tract was then injected with a total of 5 mL of fibrin sealant.  The delivery system was removed and manual pressure was held on the groin in the operating room for 10 minutes.  A FemoStop Groin compression device was applied and the patient was taken directly to the Post Anesthesia Care Unit in stable condition.  There were no operative complications and the procedure was tolerated very well.     Salvatore Decent. Cornelius Moras, M.D.     CHO/MEDQ  D:  08/05/2010  T:  08/05/2010  Job:  [id number]  Electronically Signed by Tressie Stalker M.D. on 08/06/2010 02:57:15 PM

## 2010-08-12 ENCOUNTER — Encounter (INDEPENDENT_AMBULATORY_CARE_PROVIDER_SITE_OTHER): Payer: Self-pay | Admitting: Thoracic Surgery (Cardiothoracic Vascular Surgery)

## 2010-08-12 DIAGNOSIS — I898 Other specified noninfective disorders of lymphatic vessels and lymph nodes: Secondary | ICD-10-CM

## 2010-08-12 NOTE — Assessment & Plan Note (Signed)
OFFICE VISIT  Briana, Moran DOB:  1950-07-09                                        August 12, 2010 CHART #:  [id number]  HISTORY OF PRESENT ILLNESS:  The patient returns for followup of recurrent right groin lymphocele.  She recently underwent aspiration with fibrin glue injection of her lymphocele on July 16.  She returns to the office today and reports that unfortunately her small lymphocele reoccurred after this.  She states that within a couple of days it returned to about it is typical size.  There has been no associated tenderness nor pain at all, and she otherwise feels quite well.  PHYSICAL EXAMINATION:  Notable for well-appearing female with blood pressure 148/84, pulse 64, and oxygen saturation 100%.  Auscultation of the chest reveals clear breath sounds.  Examination of the groin is notable for a small lymphocele in the right groin which has recurred. There is no surrounding cellulitis nor tenderness.  IMPRESSION:  Recurrent small right groin lymphocele which has failed repeated aspiration and now an attempted aspiration with fibrin glue injection.  Options include continued observation versus an attempted open surgical repair.  PLAN:  I have discussed matters at length with Briana Moran.  She wants to hold off for now and does not wish to proceed with open surgical repair of her groin lymphocele.  As long as it does not get any better nor does it develop any signs of infection I think this is reasonable. However, if it continues to large in size or if she starts to get any associated pain then I think we should proceed with open surgical repair.  All of her questions have been addressed.  We will plan to see her back in 3 weeks.  Salvatore Decent. Cornelius Moras, M.D. Electronically Signed  CHO/MEDQ  D:  08/12/2010  T:  08/12/2010  Job:  [id number]

## 2010-08-28 ENCOUNTER — Encounter (INDEPENDENT_AMBULATORY_CARE_PROVIDER_SITE_OTHER): Payer: 59

## 2010-08-28 DIAGNOSIS — I898 Other specified noninfective disorders of lymphatic vessels and lymph nodes: Secondary | ICD-10-CM

## 2010-08-28 NOTE — Assessment & Plan Note (Signed)
[place] OFFICE VISIT  BENA, KOBEL DOB:  [birth year]                                        August 28, 2010 CHART #:  [id number]  The patient returns for further followup of her recurrent right groin lymphocele.  Unfortunately, her lymphocele has recurred despite an attempt at fibrin glue injection several weeks ago.  She states that it has been stable since its immediate recurrence and has not gotten any larger in size.  It is not painful.  However, she is interested in elective surgical intervention to definitively take care of this problem.  We discussed leaving it alone versus open surgical approach for ligation and reclosure.  She is scheduled to undergo DC cardioversion in the near future for persistent atrial flutter.  She remains on Coumadin at this time.  We would like to have her off of Coumadin for several days prior to elective surgical intervention.  She also plans to go on vacation within the next few weeks.  As a result, we will put off any sort of elective surgical intervention for repair of her right groin lymphocele until after she comes back from vacation in September.  We will plan to see her back in 4 weeks for followup at that time and make definitive plans.  All of her questions have been addressed.  Salvatore Decent. Cornelius Moras, M.D. Electronically Signed  CHO/MEDQ  D:  08/28/2010  T:  08/28/2010  Job:  [id number]

## 2010-09-20 NOTE — Progress Notes (Signed)
Created prior to Epic go-live at [facility].  Closing encounter. ° °

## 2010-09-30 ENCOUNTER — Encounter: Payer: 59 | Admitting: Thoracic Surgery (Cardiothoracic Vascular Surgery)

## 2010-11-04 ENCOUNTER — Telehealth: Payer: Self-pay | Admitting: Thoracic Surgery (Cardiothoracic Vascular Surgery)

## 2010-11-04 NOTE — Telephone Encounter (Signed)
I contacted the patient via telephone today to check to see how she has been doing. I recently received an office note from Dr. Reuel Boom. She apparently was hospitalized recently do to severe iron deficient anemia for which she require blood transfusion. Since then amiodarone and Coumadin have both discontinued. She never did have cardioversion performed and she apparently remains in atrial flutter. She reports that during the hospital stay she underwent upper GI endoscopy and colonoscopy. The source for blood loss was not clearly discovered.  We again discussed the presence of her small lymphocele in the right groin. She does not have a false aneurysm and she never has had a false aneurysm. The last time she saw me in the office we discussed possible open surgical repair of her lymphocele. She has wanted to avoid this in the past, and previous attempts to eliminate the lymphocele consisted of repeat needle aspiration and one attempt for percutaneous application of bio glue. At this point I think it is safe to say that open surgical repair would be necessary. Now that she is off of Coumadin and we could proceed at any time once her source of occult blood loss has been discovered. I do not feel that it is likely that her lymphocele could in any way contribute to her anemia.  The patient is interested in considering open surgical repair of her lymphocele once her problems with occult blood loss have been sorted out. She will call at some point in the future to schedule an office appointment. All of her questions have been addressed

## 2010-11-29 ENCOUNTER — Encounter: Payer: Self-pay | Admitting: Thoracic Surgery (Cardiothoracic Vascular Surgery)

## 2010-11-29 ENCOUNTER — Encounter (HOSPITAL_COMMUNITY): Payer: Self-pay | Admitting: Pharmacy Technician

## 2010-11-29 ENCOUNTER — Other Ambulatory Visit: Payer: Self-pay

## 2010-11-29 DIAGNOSIS — I340 Nonrheumatic mitral (valve) insufficiency: Secondary | ICD-10-CM | POA: Insufficient documentation

## 2010-11-29 DIAGNOSIS — Q211 Atrial septal defect: Secondary | ICD-10-CM | POA: Insufficient documentation

## 2010-11-29 DIAGNOSIS — I4891 Unspecified atrial fibrillation: Secondary | ICD-10-CM | POA: Insufficient documentation

## 2010-11-29 DIAGNOSIS — I4892 Unspecified atrial flutter: Secondary | ICD-10-CM | POA: Insufficient documentation

## 2010-11-29 DIAGNOSIS — K219 Gastro-esophageal reflux disease without esophagitis: Secondary | ICD-10-CM

## 2010-12-02 ENCOUNTER — Ambulatory Visit: Payer: 59 | Admitting: Thoracic Surgery (Cardiothoracic Vascular Surgery)

## 2010-12-02 ENCOUNTER — Encounter: Payer: Self-pay | Admitting: Thoracic Surgery (Cardiothoracic Vascular Surgery)

## 2010-12-02 ENCOUNTER — Encounter (HOSPITAL_COMMUNITY)
Admission: RE | Admit: 2010-12-02 | Discharge: 2010-12-02 | Disposition: A | Discharge status: Home / Self Care | Payer: 59 | Source: Ambulatory Visit | Attending: Thoracic Surgery (Cardiothoracic Vascular Surgery) | Admitting: Thoracic Surgery (Cardiothoracic Vascular Surgery)

## 2010-12-02 ENCOUNTER — Ambulatory Visit (INDEPENDENT_AMBULATORY_CARE_PROVIDER_SITE_OTHER): Payer: 59 | Admitting: Thoracic Surgery (Cardiothoracic Vascular Surgery)

## 2010-12-02 ENCOUNTER — Other Ambulatory Visit: Payer: Self-pay

## 2010-12-02 ENCOUNTER — Encounter (HOSPITAL_COMMUNITY): Payer: Self-pay

## 2010-12-02 VITALS — BP 156/90 | HR 68 | Resp 18 | Ht 64.0 in | Wt 128.0 lb

## 2010-12-02 DIAGNOSIS — I898 Other specified noninfective disorders of lymphatic vessels and lymph nodes: Secondary | ICD-10-CM

## 2010-12-02 HISTORY — DX: Anemia, unspecified: D64.9

## 2010-12-02 HISTORY — DX: Depression, unspecified: F32.A

## 2010-12-02 HISTORY — DX: Shortness of breath: R06.02

## 2010-12-02 HISTORY — DX: Major depressive disorder, single episode, unspecified: F32.9

## 2010-12-02 HISTORY — DX: Cardiac murmur, unspecified: R01.1

## 2010-12-02 HISTORY — DX: Gastro-esophageal reflux disease without esophagitis: K21.9

## 2010-12-02 LAB — CBC
Hemoglobin: 10.8 g/dL — ABNORMAL LOW (ref 12.0–15.0)
RBC: 3.62 MIL/uL — ABNORMAL LOW (ref 3.87–5.11)

## 2010-12-02 LAB — COMPREHENSIVE METABOLIC PANEL
ALT: 28 U/L (ref 0–35)
Alkaline Phosphatase: 111 U/L (ref 39–117)
CO2: 26 mEq/L (ref 19–32)
Chloride: 101 mEq/L (ref 96–112)
GFR calc Af Amer: >90 mL/min (ref >90)
GFR calc non Af Amer: >90 mL/min (ref >90)
Glucose, Bld: 91 mg/dL (ref 70–99)
Potassium: 5.3 mEq/L — ABNORMAL HIGH (ref 3.5–5.1)
Sodium: 139 mEq/L (ref 135–145)
Total Bilirubin: 0.3 mg/dL (ref 0.3–1.2)

## 2010-12-02 LAB — SURGICAL PCR SCREEN: Staphylococcus aureus: NEGATIVE

## 2010-12-02 LAB — APTT: aPTT: 29 seconds (ref 24–37)

## 2010-12-02 NOTE — H&P (Signed)
Purcell Nails, MD  12/02/2010 12:53 PM  Signed  PCP is Donata Duff, DO Referring Provider is Lanell Matar., DO    Chief Complaint   Patient presents with   .  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  The patient underwent numerous percutaneous aspirations in the office and ultimately an attempt at percutaneous aspiration with injection of fibrin glue in July 2012, all of which failed.  The patient now presents for elective open repair of right groin lymphocele.    Past Medical History   Diagnosis  Date   .  HTN (hypertension)     .  Atrial flutter     .  Afib     .  Mitral regurgitation     .  Secondary pulmonary hypertension     .  Fatigue     .  Atrial septal defect     .  Reflux     .  Heart murmur         ASD  DR DANIEL IN [place]    .  Shortness of breath     .  Anemia         1 MO AGO , RESOLVED   .  GERD (gastroesophageal reflux disease)     .  Depression         Past Surgical History   Procedure  Date   .  Aspiration and fibrin glue inj. of right groin lymphocele  08/05/2010   .  Asd repair  03/05/2010       right mini thoracotomy for patch closure ASD - Dr. Cornelius Moras   .  Maze  03/05/2010       complete biatrial lesion set - cryo     History reviewed. No pertinent family history.  Social History History   Substance Use Topics   .  Smoking status:  Former Smoker       Types:  Cigarettes       Quit date:  01/20/1978   .  Smokeless tobacco:  Not on file   .  Alcohol Use:  Yes         not on a regular basis       Current Outpatient Prescriptions   Medication  Sig  Dispense  Refill   .  aspirin 81 MG EC tablet  Take 81 mg by mouth daily.           Marland Kitchen  atenolol  (TENORMIN) 50 MG tablet  Take 50 mg by mouth daily.          .  calcium-vitamin D (OSCAL WITH D) 500-200 MG-UNIT per tablet  Take 1 tablet by mouth daily.          .  magnesium oxide (MAG-OX) 400 MG tablet  Take 400 mg by mouth daily after lunch daily after lunch.           Marland Kitchen  omeprazole (PRILOSEC) 20 MG capsule  Take 20 mg by mouth daily.           .  vitamin B-12 (CYANOCOBALAMIN) 1000 MCG tablet  Take 1,000 mcg by mouth daily after lunch daily after lunch.           Marland Kitchen  warfarin (COUMADIN) 2 MG tablet  Take 2 mg by mouth daily. Take 1 tablet on tuesday and thursday          .  warfarin (COUMADIN) 5 MG tablet  Take 2.5 mg by mouth daily. Take 1 tablet on Monday, Wednesday, Friday and saturday          .  amiodarone (PACERONE) 200 MG tablet  Take 200 mg by mouth 2 (two) times daily.             No Known Allergies  Review of Systems: The patient reports feeling tired but otherwise no specific complaints. She does not have shortness of breath. She does not have fevers or chills. She does not have chest pain. Appetite is stable. Bowel function is regular. She has no pain at the site of her right groin lymphocele was she happens to bump the area inadvertently. The remainder of her venous systems is unremarkable.  BP 156/90  Pulse 68  Resp 18  Ht 5\' 4"  (1.626 m)  Wt 128 lb (58.06 kg)  BMI 21.97 kg/m2  SpO2 99% Physical Exam: Well-appearing female in no acute distress. HEENT exam is unrevealing. The neck is supple. Auscultation of the chest demonstrates clear breath sounds which are symmetrical bilaterally. Cardiovascular exam is notable for regular rate and rhythm. No murmurs rubs or gallops are appreciated. The abdomen is soft and nontender. The extremities are warm and well-perfused. There is a soft but somewhat firm 4 x 2 cm 8-shaped mass in the right groin consistent with lymphocele. There is no surrounding erythema. The mass is not pulsatile. The remainder of her review of systems  unremarkable.   Impression: Persistent postoperative right groin lymphocele at site of open femoral arterial cannulation at the time of surgery performed February 2012. This lymphocele has persisted despite numerous attempts at percutaneous drainage as well as percutaneous drainage with application of fibrin glue. The size of the lymphocele has remained very stable over 6 months, but the patient is still concerned about it and wants to have something done to remove it completely. She has no associated symptoms other than occasional mild pain if she bumps into this mass. She is concerned about its cosmetic appearance.  Plan: We discussed at length the indications and potential benefits of open repair of right groin lymphocele. We specifically discussed a variety of possible complications that could occur including any potential complications related to the fact that the patient will have surgery under brief general anesthesia. She has had atrial flutter since her surgery last February, but this has been quite stable and rate controlled. Recently she has not been on Coumadin until 2 days ago when she started taking Coumadin again. She is contemplating whether or not to go back on amiodarone therapy. At this time her rate and rhythm remained stable and should likely pose no significant associated at risk given the minor surgical procedure that is planned. She understands the small risk that she could develop wound infection at the site of repair of lymphocele, and this could be problematic. She also understands that she may have considerable pain and/or numbness at the site of surgery or extending down her right thigh with the possibility for injury to the cutaneous nerves supplying the medial aspect of the right thigh. She understands and accepts all potential associated risks of surgery and desires to proceed as described. She has been instructed to stop taking Coumadin now until after the surgery as been  completed. We will check her  prothrombin time later today to make sure that she has not already therapeutic on Coumadin.

## 2010-12-02 NOTE — Pre-Procedure Instructions (Signed)
20 Briana Moran  12/02/2010   Your procedure is scheduled on: Friday 12/06/10   Report to [facility] at 530 AM.  Call this number if you have problems the morning of surgery: [phone]   Remember:   Do not eat food:After Midnight.  Do not drink clear liquids: 4 Hours before arrival.  Take these medicines the morning of surgery with A SIP OF WATER: amiodarone, atenolol, prilosec ,FLOMAX   Do not wear jewelry, make-up or nail polish.  Do not wear lotions, powders, or perfumes. You may wear deodorant.  Do not shave 48 hours prior to surgery.  Do not bring valuables to the hospital.  Contacts, dentures or bridgework may not be worn into surgery.  Leave suitcase in the car. After surgery it may be brought to your room.  For patients admitted to the hospital, checkout time is 11:00 AM the day of discharge.   Patients discharged the day of surgery will not be allowed to drive home.  Name and phone number of your driver: [name]  Physicist, medical)  Special Instructions: CHG Shower Use Special Wash: 1/2 bottle night before surgery and 1/2 bottle morning of surgery.   Please read over the following fact sheets that you were given: Pain Booklet and MRSA Information

## 2010-12-02 NOTE — Patient Instructions (Signed)
Stop taking coumadin between now and surgery.  Restart coumadin 11/17 as previously ordered.

## 2010-12-02 NOTE — Progress Notes (Signed)
PCP is Donata Duff, DO Referring Provider is Lanell Matar., DO  Chief Complaint  Patient presents with  . 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  The patient underwent numerous percutaneous aspirations in the office and ultimately an attempt at percutaneous aspiration with injection of fibrin glue in July 2012, all of which failed.  The patient now presents for elective open repair of right groin lymphocele.  Past Medical History  Diagnosis Date  . HTN (hypertension)   . Atrial flutter   . Afib   . Mitral regurgitation   . Secondary pulmonary hypertension   . Fatigue   . Atrial septal defect   . Reflux   . Heart murmur     ASD  DR DANIEL IN [place]   . Shortness of breath   . Anemia     1 MO AGO , RESOLVED  . GERD (gastroesophageal reflux disease)   . Depression     Past Surgical History  Procedure Date  . Aspiration and fibrin glue inj. of right groin lymphocele 08/05/2010  . Asd repair 03/05/2010    right mini thoracotomy for patch closure ASD - Dr. Cornelius Moras  . Maze 03/05/2010    complete biatrial lesion set - cryo    History reviewed. No pertinent family history.  Social History History  Substance Use Topics  . Smoking status: Former Smoker    Types: Cigarettes    Quit date: 01/20/1978  . Smokeless tobacco: Not on file  . Alcohol Use: Yes     not on a regular basis    Current Outpatient Prescriptions  Medication Sig Dispense Refill  . aspirin 81 MG EC tablet Take 81 mg by mouth daily.        Marland Kitchen atenolol (TENORMIN) 50 MG tablet Take 50 mg by mouth daily.       . calcium-vitamin D (OSCAL WITH D) 500-200 MG-UNIT per tablet Take 1 tablet by mouth daily.       . magnesium oxide  (MAG-OX) 400 MG tablet Take 400 mg by mouth daily after lunch daily after lunch.        Marland Kitchen omeprazole (PRILOSEC) 20 MG capsule Take 20 mg by mouth daily.        . vitamin B-12 (CYANOCOBALAMIN) 1000 MCG tablet Take 1,000 mcg by mouth daily after lunch daily after lunch.        . warfarin (COUMADIN) 2 MG tablet Take 2 mg by mouth daily. Take 1 tablet on tuesday and thursday       . warfarin (COUMADIN) 5 MG tablet Take 2.5 mg by mouth daily. Take 1 tablet on Monday, Wednesday, Friday and saturday       . amiodarone (PACERONE) 200 MG tablet Take 200 mg by mouth 2 (two) times daily.          No Known Allergies  Review of Systems: The patient reports feeling tired but otherwise no specific complaints. She does not have shortness of breath. She does not have fevers or chills. She does not have chest pain. Appetite is stable. Bowel function is regular. She has no pain at the site of her right groin lymphocele was she happens to bump the area inadvertently. The remainder  of her venous systems is unremarkable.  BP 156/90  Pulse 68  Resp 18  Ht 5\' 4"  (1.626 m)  Wt 128 lb (58.06 kg)  BMI 21.97 kg/m2  SpO2 99% Physical Exam: Well-appearing female in no acute distress. HEENT exam is unrevealing. The neck is supple. Auscultation of the chest demonstrates clear breath sounds which are symmetrical bilaterally. Cardiovascular exam is notable for regular rate and rhythm. No murmurs rubs or gallops are appreciated. The abdomen is soft and nontender. The extremities are warm and well-perfused. There is a soft but somewhat firm 4 x 2 cm 8-shaped mass in the right groin consistent with lymphocele. There is no surrounding erythema. The mass is not pulsatile. The remainder of her review of systems unremarkable.  Diagnostic Tests: none  Impression: Persistent postoperative right groin lymphocele at site of open femoral arterial cannulation at the time of surgery performed February 2012. This lymphocele has persisted  despite numerous attempts at percutaneous drainage as well as percutaneous drainage with application of fibrin glue. The size of the lymphocele has remained very stable over 6 months, but the patient is still concerned about it and wants to have something done to remove it completely. She has no associated symptoms other than occasional mild pain if she bumps into this mass. She is concerned about its cosmetic appearance.  Plan: We discussed at length the indications and potential benefits of open repair of right groin lymphocele. We specifically discussed a variety of possible complications that could occur including any potential complications related to the fact that the patient will have surgery under brief general anesthesia. She has had atrial flutter since her surgery last February, but this has been quite stable and rate controlled. Recently she has not been on Coumadin until 2 days ago when she started taking Coumadin again. She is contemplating whether or not to go back on amiodarone therapy. At this time her rate and rhythm remained stable and should likely pose no significant associated at risk given the minor surgical procedure that is planned. She understands the small risk that she could develop wound infection at the site of repair of lymphocele, and this could be problematic. She also understands that she may have considerable pain and/or numbness at the site of surgery or extending down her right thigh with the possibility for injury to the cutaneous nerves supplying the medial aspect of the right thigh. She understands and accepts all potential associated risks of surgery and desires to proceed as described. She has been instructed to stop taking Coumadin now until after the surgery as been completed. We will check her prothrombin time later today to make sure that she has not already therapeutic on Coumadin.

## 2010-12-03 NOTE — Consult Note (Signed)
Anesthesia:  60 year old female for repair of right groin lymphocele.  She is s/p right mini-thoracotomy for large secundum ASD repair on 03/05/10.  Other hx includes HTN, afib/flutter, seconadry pulmonary HTN, depression, MR.  Her Cardiologist is Dr. Reuel Boom at [facility] [phone]).  Office visit from 10/30/10 in on chart for review.  He ordered an echocardiogram at that visit which I have requested.  (Her intra-op TEE results are in Epic under Notes.)    Labs noted.  CXR and EKG were not done at her PAT appointment, so nursing staff plan to obtain on the DOS.  If these results are reasonable then would plan to proceed.

## 2010-12-05 MED ORDER — DEXTROSE 5 % IV SOLN
1.5000 g | INTRAVENOUS | Status: DC
  Filled 2010-12-05: qty 1.5

## 2010-12-06 ENCOUNTER — Other Ambulatory Visit: Payer: Self-pay | Admitting: Thoracic Surgery (Cardiothoracic Vascular Surgery)

## 2010-12-06 ENCOUNTER — Encounter (HOSPITAL_COMMUNITY): Payer: Self-pay | Admitting: Vascular Surgery

## 2010-12-06 ENCOUNTER — Ambulatory Visit (HOSPITAL_COMMUNITY)
Admission: RE | Admit: 2010-12-06 | Discharge: 2010-12-06 | Disposition: A | Discharge status: Home / Self Care | Payer: 59 | Source: Ambulatory Visit | Attending: Thoracic Surgery (Cardiothoracic Vascular Surgery) | Admitting: Thoracic Surgery (Cardiothoracic Vascular Surgery)

## 2010-12-06 ENCOUNTER — Encounter (HOSPITAL_COMMUNITY): Payer: Self-pay | Admitting: Thoracic Surgery (Cardiothoracic Vascular Surgery)

## 2010-12-06 ENCOUNTER — Ambulatory Visit (HOSPITAL_COMMUNITY): Payer: 59 | Admitting: Vascular Surgery

## 2010-12-06 ENCOUNTER — Ambulatory Visit (HOSPITAL_COMMUNITY): Payer: 59

## 2010-12-06 ENCOUNTER — Other Ambulatory Visit: Payer: Self-pay

## 2010-12-06 ENCOUNTER — Encounter (HOSPITAL_COMMUNITY)
Admission: RE | Disposition: A | Discharge status: Home / Self Care | Payer: Self-pay | Source: Ambulatory Visit | Attending: Thoracic Surgery (Cardiothoracic Vascular Surgery)

## 2010-12-06 ENCOUNTER — Encounter: Payer: Self-pay | Admitting: Thoracic Surgery (Cardiothoracic Vascular Surgery)

## 2010-12-06 DIAGNOSIS — I898 Other specified noninfective disorders of lymphatic vessels and lymph nodes: Secondary | ICD-10-CM

## 2010-12-06 DIAGNOSIS — Z01812 Encounter for preprocedural laboratory examination: Secondary | ICD-10-CM | POA: Insufficient documentation

## 2010-12-06 DIAGNOSIS — I10 Essential (primary) hypertension: Secondary | ICD-10-CM | POA: Insufficient documentation

## 2010-12-06 DIAGNOSIS — K219 Gastro-esophageal reflux disease without esophagitis: Secondary | ICD-10-CM | POA: Insufficient documentation

## 2010-12-06 DIAGNOSIS — Z01810 Encounter for preprocedural cardiovascular examination: Secondary | ICD-10-CM | POA: Insufficient documentation

## 2010-12-06 HISTORY — PX: OTHER SURGICAL HISTORY: SHX169

## 2010-12-06 SURGERY — DRAINAGE AND CLOSURE OF LYMPHOCELE
Anesthesia: General | Site: Groin | Laterality: Right | Wound class: Clean

## 2010-12-06 MED ORDER — PROPOFOL 10 MG/ML IV EMUL
INTRAVENOUS | Status: DC | PRN
  Administered 2010-12-06: 40 mg via INTRAVENOUS
  Administered 2010-12-06: 110 mg via INTRAVENOUS

## 2010-12-06 MED ORDER — NEOSTIGMINE METHYLSULFATE 1 MG/ML IJ SOLN
INTRAMUSCULAR | Status: DC | PRN
  Administered 2010-12-06: 5 mg via INTRAVENOUS

## 2010-12-06 MED ORDER — MORPHINE SULFATE 2 MG/ML IJ SOLN: 0.0500 mg/kg | INTRAMUSCULAR | Status: DC | PRN

## 2010-12-06 MED ORDER — GLYCOPYRROLATE 0.2 MG/ML IJ SOLN
INTRAMUSCULAR | Status: DC | PRN
  Administered 2010-12-06: .6 mg via INTRAVENOUS

## 2010-12-06 MED ORDER — FIBRIN SEALANT COMPONENT 5 ML EX KIT
PACK | CUTANEOUS | Status: DC | PRN
  Administered 2010-12-06: 5 mL via TOPICAL

## 2010-12-06 MED ORDER — LACTATED RINGERS IV SOLN: INTRAVENOUS | Status: DC

## 2010-12-06 MED ORDER — DEXTROSE 5 % IV SOLN
1.5000 g | INTRAVENOUS | Status: DC | PRN
  Administered 2010-12-06: 1.5 g via INTRAVENOUS

## 2010-12-06 MED ORDER — MEPERIDINE HCL 25 MG/ML IJ SOLN: 6.2500 mg | INTRAMUSCULAR | Status: DC | PRN

## 2010-12-06 MED ORDER — ROCURONIUM BROMIDE 100 MG/10ML IV SOLN
INTRAVENOUS | Status: DC | PRN
  Administered 2010-12-06: 30 mg via INTRAVENOUS

## 2010-12-06 MED ORDER — OXYCODONE-ACETAMINOPHEN 5-325 MG PO TABS
1.5000 | ORAL_TABLET | ORAL | Status: DC | PRN
  Administered 2010-12-06: 1.5 via ORAL

## 2010-12-06 MED ORDER — ONDANSETRON HCL 4 MG/2ML IJ SOLN
INTRAMUSCULAR | Status: DC | PRN
  Administered 2010-12-06: 4 mg via INTRAVENOUS

## 2010-12-06 MED ORDER — MIDAZOLAM HCL 5 MG/5ML IJ SOLN
INTRAMUSCULAR | Status: DC | PRN
  Administered 2010-12-06: 2 mg via INTRAVENOUS

## 2010-12-06 MED ORDER — MIDAZOLAM HCL 2 MG/2ML IJ SOLN: 0.5000 mg | Freq: Once | INTRAMUSCULAR | Status: DC | PRN

## 2010-12-06 MED ORDER — HYDROMORPHONE HCL PF 1 MG/ML IJ SOLN
0.2500 mg | INTRAMUSCULAR | Status: DC | PRN
  Administered 2010-12-06: 0.25 mg via INTRAVENOUS

## 2010-12-06 MED ORDER — FENTANYL CITRATE 0.05 MG/ML IJ SOLN
INTRAMUSCULAR | Status: DC | PRN
  Administered 2010-12-06: 100 ug via INTRAVENOUS

## 2010-12-06 MED ORDER — LACTATED RINGERS IV SOLN
INTRAVENOUS | Status: DC | PRN
  Administered 2010-12-06: 07:00:00 via INTRAVENOUS

## 2010-12-06 MED ORDER — OXYCODONE-ACETAMINOPHEN 7.5-325 MG PO TABS: 1.0000 | ORAL_TABLET | ORAL | Status: DC | PRN

## 2010-12-06 MED ORDER — PROMETHAZINE HCL 25 MG/ML IJ SOLN: 6.2500 mg | INTRAMUSCULAR | Status: DC | PRN

## 2010-12-06 SURGICAL SUPPLY — 29 items
CANISTER SUCTION 2500CC (MISCELLANEOUS) ×3 IMPLANT
CLIP TI MEDIUM 24 (CLIP) ×9 IMPLANT
CLIP TI WIDE RED SMALL 24 (CLIP) ×3 IMPLANT
CONT SPEC 4OZ CLIKSEAL STRL BL (MISCELLANEOUS) ×3 IMPLANT
COVER SURGICAL LIGHT HANDLE (MISCELLANEOUS) ×6 IMPLANT
DERMABOND ADVANCED (GAUZE/BANDAGES/DRESSINGS) ×1
DERMABOND ADVANCED .7 DNX12 (GAUZE/BANDAGES/DRESSINGS) ×2 IMPLANT
DRAPE ORTHO SPLIT 77X108 STRL (DRAPES) ×2
DRAPE PROXIMA HALF (DRAPES) ×3 IMPLANT
DRAPE SURG ORHT 6 SPLT 77X108 (DRAPES) ×4 IMPLANT
ELECT REM PT RETURN 9FT ADLT (ELECTROSURGICAL) ×3
ELECTRODE REM PT RTRN 9FT ADLT (ELECTROSURGICAL) ×2 IMPLANT
GLOVE BIOGEL PI IND STRL 6.5 (GLOVE) ×2 IMPLANT
GLOVE BIOGEL PI INDICATOR 6.5 (GLOVE) ×1
GLOVE EXAM NITRILE XS STR PU (GLOVE) ×6 IMPLANT
GLOVE SURG ORTHO 7.0 STRL STRW (GLOVE) ×3 IMPLANT
GOWN STRL NON-REIN LRG LVL3 (GOWN DISPOSABLE) ×6 IMPLANT
KIT BASIN OR (CUSTOM PROCEDURE TRAY) ×3 IMPLANT
KIT ROOM TURNOVER OR (KITS) ×3 IMPLANT
NS IRRIG 1000ML POUR BTL (IV SOLUTION) ×3 IMPLANT
PAD ARMBOARD 7.5X6 YLW CONV (MISCELLANEOUS) ×3 IMPLANT
SPONGE GAUZE 4X4 12PLY (GAUZE/BANDAGES/DRESSINGS) ×3 IMPLANT
SUT MNCRL AB 4-0 PS2 18 (SUTURE) ×3 IMPLANT
SUT VIC AB 2-0 CT1 18 (SUTURE) ×6 IMPLANT
SUT VIC AB 3-0 SH 27 (SUTURE) ×2
SUT VIC AB 3-0 SH 27X BRD (SUTURE) ×4 IMPLANT
TOWEL OR 17X24 6PK STRL BLUE (TOWEL DISPOSABLE) ×3 IMPLANT
TOWEL OR 17X26 10 PK STRL BLUE (TOWEL DISPOSABLE) ×3 IMPLANT
WATER STERILE IRR 1000ML POUR (IV SOLUTION) ×3 IMPLANT

## 2010-12-06 NOTE — Preoperative (Signed)
Beta Blockers   Reason not to administer Beta Blockers:PT TOOK ATENOLOL 50MG  PO @0400AM 

## 2010-12-06 NOTE — Transfer of Care (Signed)
Immediate Anesthesia Transfer of Care Note  Patient: Briana Moran  Procedure(s) Performed:  DRAINAGE AND CLOSURE OF LYMPHOCELE  Patient Location: PACU  Anesthesia Type: General  Level of Consciousness: awake, oriented, sedated, patient cooperative and responds to stimulation  Airway & Oxygen Therapy: Patient Spontanous Breathing and Patient connected to nasal cannula oxygen  Post-op Assessment: Report given to PACU RN and Post -op Vital signs reviewed and stable  Post vital signs: Reviewed and stable  Complications: No apparent anesthesia complications

## 2010-12-06 NOTE — H&P (View-Only) (Signed)
OWEN,CLARENCE H, MD  12/02/2010 12:53 PM  Signed  PCP is FARRAH,VICTOR B, DO Referring Provider is Daniel, Kurt R., DO    Chief Complaint   Patient presents with   .  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  The patient underwent numerous percutaneous aspirations in the office and ultimately an attempt at percutaneous aspiration with injection of fibrin glue in July 2012, all of which failed.  The patient now presents for elective open repair of right groin lymphocele.    Past Medical History   Diagnosis  Date   .  HTN (hypertension)     .  Atrial flutter     .  Afib     .  Mitral regurgitation     .  Secondary pulmonary hypertension     .  Fatigue     .  Atrial septal defect     .  Reflux     .  Heart murmur         ASD  [place] DANIEL [place]    .  Shortness of breath     .  Anemia         1 MO AGO , RESOLVED   .  GERD (gastroesophageal reflux disease)     .  Depression         Past Surgical History   Procedure  Date   .  Aspiration and fibrin glue inj. of right groin lymphocele  08/05/2010   .  Asd repair  03/05/2010       right mini thoracotomy for patch closure ASD - Dr. Owen   .  Maze  03/05/2010       complete biatrial lesion set - cryo     History reviewed. No pertinent family history.  Social History History   Substance Use Topics   .  Smoking status:  Former Smoker       Types:  Cigarettes       Quit date:  01/20/1978   .  Smokeless tobacco:  Not on file   .  Alcohol Use:  Yes         not on a regular basis       Current Outpatient Prescriptions   Medication  Sig  Dispense  Refill   .  aspirin 81 MG EC tablet  Take 81 mg by mouth daily.           .  atenolol  (TENORMIN) 50 MG tablet  Take 50 mg by mouth daily.          .  calcium-vitamin D (OSCAL WITH D) 500-200 MG-UNIT per tablet  Take 1 tablet by mouth daily.          .  magnesium oxide (MAG-OX) 400 MG tablet  Take 400 mg by mouth daily after lunch daily after lunch.           .  omeprazole (PRILOSEC) 20 MG capsule  Take 20 mg by mouth daily.           .  vitamin B-12 (CYANOCOBALAMIN) 1000 MCG tablet  Take 1,000 mcg by mouth daily after lunch daily after lunch.           .    warfarin (COUMADIN) 2 MG tablet  Take 2 mg by mouth daily. Take 1 tablet on tuesday and thursday          .  warfarin (COUMADIN) 5 MG tablet  Take 2.5 mg by mouth daily. Take 1 tablet on Monday, Wednesday, Friday and saturday          .  amiodarone (PACERONE) 200 MG tablet  Take 200 mg by mouth 2 (two) times daily.             No Known Allergies  Review of Systems: The patient reports feeling tired but otherwise no specific complaints. She does not have shortness of breath. She does not have fevers or chills. She does not have chest pain. Appetite is stable. Bowel function is regular. She has no pain at the site of her right groin lymphocele was she happens to bump the area inadvertently. The remainder of her venous systems is unremarkable.  BP 156/90  Pulse 68  Resp 18  Ht 5' 4" (1.626 m)  Wt 128 lb (58.06 kg)  BMI 21.97 kg/m2  SpO2 99% Physical Exam: Well-appearing female in no acute distress. HEENT exam is unrevealing. The neck is supple. Auscultation of the chest demonstrates clear breath sounds which are symmetrical bilaterally. Cardiovascular exam is notable for regular rate and rhythm. No murmurs rubs or gallops are appreciated. The abdomen is soft and nontender. The extremities are warm and well-perfused. There is a soft but somewhat firm 4 x 2 cm 8-shaped mass in the right groin consistent with lymphocele. There is no surrounding erythema. The mass is not pulsatile. The remainder of her review of systems  unremarkable.   Impression: Persistent postoperative right groin lymphocele at site of open femoral arterial cannulation at the time of surgery performed February 2012. This lymphocele has persisted despite numerous attempts at percutaneous drainage as well as percutaneous drainage with application of fibrin glue. The size of the lymphocele has remained very stable over 6 months, but the patient is still concerned about it and wants to have something done to remove it completely. She has no associated symptoms other than occasional mild pain if she bumps into this mass. She is concerned about its cosmetic appearance.  Plan: We discussed at length the indications and potential benefits of open repair of right groin lymphocele. We specifically discussed a variety of possible complications that could occur including any potential complications related to the fact that the patient will have surgery under brief general anesthesia. She has had atrial flutter since her surgery last February, but this has been quite stable and rate controlled. Recently she has not been on Coumadin until 2 days ago when she started taking Coumadin again. She is contemplating whether or not to go back on amiodarone therapy. At this time her rate and rhythm remained stable and should likely pose no significant associated at risk given the minor surgical procedure that is planned. She understands the small risk that she could develop wound infection at the site of repair of lymphocele, and this could be problematic. She also understands that she may have considerable pain and/or numbness at the site of surgery or extending down her right thigh with the possibility for injury to the cutaneous nerves supplying the medial aspect of the right thigh. She understands and accepts all potential associated risks of surgery and desires to proceed as described. She has been instructed to stop taking Coumadin now until after the surgery as been  completed. We will check her   prothrombin time later today to make sure that she has not already therapeutic on Coumadin.   

## 2010-12-06 NOTE — Progress Notes (Signed)
Chin to nipple prep was ordered by md office,pt having rt groin repair.

## 2010-12-06 NOTE — Interval H&P Note (Signed)
History and Physical Interval Note:   12/06/2010   7:36 AM   Briana Moran  has presented today for surgery, with the diagnosis of (R) GROIN LYMPHOCELE  The various methods of treatment have been discussed with the patient and family. After consideration of risks, benefits and other options for treatment, the patient has consented to  Procedure(s): GROIN DEBRIDEMENT as a surgical intervention .  The patients' history has been reviewed, patient examined, no change in status, stable for surgery.  I have reviewed the patients' chart and labs.  Questions were answered to the patient's satisfaction.     Purcell Nails  MD

## 2010-12-06 NOTE — Op Note (Signed)
CARDIOTHORACIC SURGERY OPERATIVE NOTE  Date of Procedure: 12/06/2010  Preoperative Diagnosis: Right Groin Lymphocele Postoperative Diagnosis: Same  Procedure: Drainage and Repair of Right Groin Lymphocele   Surgeon: Salvatore Decent. Cornelius Moras, MD Assistant: n/a Anesthesia: General    BRIEF CLINICAL NOTE AND INDICATIONS FOR SURGERY  Patient is a 60 year old female who underwent right miniature thoracotomy for minimally invasive patch closure of atrial septal defect and Maze procedure on 03/05/2010. The patient developed a lymphocele at the site of her right groin cannulation which has failed conservative treatment as an outpatient. The patient now presents for elective surgical repair. She provides full informed consent for the operation as described.   DETAILS OF THE OPERATIVE PROCEDURE  The patient brought to the operating room on the above-mentioned date and placed in the supine position on the operating table. Intravenous antibiotic shortness or. General endotracheal anesthesia is induced uneventfully. The patient's right groin was prepared and draped in a sterile manner. Timeout procedures performed. Small incision is made through the patient's existing scar in the right groin. There is an obvious egg-shaped lymphocele which is entered and approximately 40 mL of thin serous fluid is evacuated. The chronic fibrous sac associated with the lymphocele is carefully excised. All associated blood vessels and lymphatic channels around the sac are carefully clipped or suture ligated individually. The wound is irrigated with saline. AdvaSeal fibrin sealant is applied to the base of the sac overlying the femoral artery and vein. The wound is closed in multiple layers with absorbable suture. The skin is closed with subcuticular skin closure. Estimated blood loss was trivial. The patient tolerated the procedure well, was extubated in the operating room, and transported to recovery room in stable condition. There  were no intraoperative complications. All sponge instrument and needle counts are verified correct.  OWEN,CLARENCE H   Clarence H. Cornelius Moras MD

## 2010-12-06 NOTE — Anesthesia Postprocedure Evaluation (Signed)
  Anesthesia Post-op Note  Patient: Briana Moran  Procedure(s) Performed:  DRAINAGE AND CLOSURE OF LYMPHOCELE  Patient Location: PACU  Anesthesia Type: General  Level of Consciousness: awake  Airway and Oxygen Therapy: Patient Spontanous Breathing  Post-op Pain: mild  Post-op Assessment: Post-op Vital signs reviewed  Post-op Vital Signs: stable  Complications: No apparent anesthesia complications

## 2010-12-06 NOTE — Anesthesia Procedure Notes (Addendum)
Procedure Name: Intubation Date/Time: 12/06/2010 7:56 AM Performed by: Wray Kearns A Pre-anesthesia Checklist: Patient identified, Emergency Drugs available, Suction available and Patient being monitored Patient Re-evaluated:Patient Re-evaluated prior to inductionOxygen Delivery Method: Circle System Utilized Preoxygenation: Pre-oxygenation with 100% oxygen Intubation Type: IV induction Ventilation: Mask ventilation without difficulty Laryngoscope Size: Mac and 3 Grade View: Grade I Tube type: Oral Tube size: 7.0 mm Number of attempts: 1 Airway Equipment and Method: stylet Placement Confirmation: ETT inserted through vocal cords under direct vision,  breath sounds checked- equal and bilateral and positive ETCO2 Secured at: 21 cm Tube secured with: Tape

## 2010-12-06 NOTE — Anesthesia Preprocedure Evaluation (Signed)
Anesthesia Evaluation  Patient identified by MRN, date of birth, ID band Patient awake    Airway Mallampati: II      Dental   Pulmonary          Cardiovascular hypertension,     Neuro/Psych    GI/Hepatic GERD-  ,  Endo/Other    Renal/GU      Musculoskeletal   Abdominal   Peds  Hematology   Anesthesia Other Findings   Reproductive/Obstetrics                           Anesthesia Physical Anesthesia Plan  ASA: III  Anesthesia Plan: General   Post-op Pain Management:    Induction: Intravenous  Airway Management Planned: Oral ETT  Additional Equipment:   Intra-op Plan:   Post-operative Plan: Extubation in OR  Informed Consent: I have reviewed the patients History and Physical, chart, labs and discussed the procedure including the risks, benefits and alternatives for the proposed anesthesia with the patient or authorized representative who has indicated his/her understanding and acceptance.   Dental advisory given and Dental Advisory Given  Plan Discussed with: CRNA  Anesthesia Plan Comments:         Anesthesia Quick Evaluation

## 2010-12-16 ENCOUNTER — Ambulatory Visit: Payer: 59 | Admitting: Thoracic Surgery (Cardiothoracic Vascular Surgery)

## 2010-12-16 ENCOUNTER — Encounter: Payer: Self-pay | Admitting: Thoracic Surgery (Cardiothoracic Vascular Surgery)

## 2010-12-16 ENCOUNTER — Ambulatory Visit (INDEPENDENT_AMBULATORY_CARE_PROVIDER_SITE_OTHER): Payer: Self-pay | Admitting: Thoracic Surgery (Cardiothoracic Vascular Surgery)

## 2010-12-16 VITALS — BP 174/94 | HR 64 | Resp 20 | Ht 64.0 in | Wt 130.0 lb

## 2010-12-16 DIAGNOSIS — Z9889 Other specified postprocedural states: Secondary | ICD-10-CM | POA: Insufficient documentation

## 2010-12-16 DIAGNOSIS — I898 Other specified noninfective disorders of lymphatic vessels and lymph nodes: Secondary | ICD-10-CM | POA: Insufficient documentation

## 2010-12-16 NOTE — Progress Notes (Signed)
                   [address]            [place] [address]          [phone]     CARDIOTHORACIC SURGERY OFFICE NOTE  Referring Provider is Donata Duff, DO PCP is Donata Duff, DO   HPI:  Patient returns for follow-up having undergone open repair of right groin lymphocele on 12/06/2010.  Since hospital discharge the patient has not had any significant problems.  She does have expected soreness in the right groin and extending down the right side. She also has some swelling around the right ankle. She otherwise has no complaints.   Current Outpatient Prescriptions  Medication Sig Dispense Refill  . amiodarone (PACERONE) 200 MG tablet Take 200 mg by mouth 2 (two) times daily.        Marland Kitchen aspirin 81 MG EC tablet Take 81 mg by mouth daily.        Marland Kitchen atenolol (TENORMIN) 50 MG tablet Take 50 mg by mouth daily.       . calcium-vitamin D (OSCAL WITH D) 500-200 MG-UNIT per tablet Take 1 tablet by mouth daily.       . magnesium oxide (MAG-OX) 400 MG tablet Take 400 mg by mouth daily after lunch daily after lunch.        Marland Kitchen omeprazole (PRILOSEC) 20 MG capsule Take 20 mg by mouth daily.        . vitamin B-12 (CYANOCOBALAMIN) 1000 MCG tablet Take 1,000 mcg by mouth daily after lunch daily after lunch.        . warfarin (COUMADIN) 2.5 MG tablet Take 2.5 mg by mouth daily. 2.5 mg po 4 days per week and 2 mg po 3 days per week           Physical Exam:   BP 174/94  Pulse 64  Resp 20  Ht 5\' 4"  (1.626 m)  Wt 130 lb (58.968 kg)  BMI 22.31 kg/m2  SpO2 98%  On physical exam the patient looks good. Breath sounds are clear. Her right groin incision is healing nicely. There is some expected soft tissue swelling in this area but there is no sign of recurrence of the lymphocele. There is no surrounding redness nor tenderness. There is moderate right lower extremity edema.  Diagnostic Tests:  n/a  Impression:  Satisfactory recovery following open repair of right groin  lymphocele. The patient does have some right lower extremity edema that presumably is a combination of unilateral venous insufficiency and/or partially obstructed drainage of lymphatics on that side. This is not unexpected.  Plan:  The patient has been advised to keep her legs propped up as much as possible when she has not ambulatory. I've also suggested that she get some compression stockings to wear with when she is up and around, particularly when she goes to work. I've advised her to minimize the amount of activity that she engages associated with flexion and extension at the hip for the next month or so. We will see her back in 2 months to make sure that she is healing nicely.    Salvatore Decent. Cornelius Moras, MD

## 2011-02-03 ENCOUNTER — Ambulatory Visit: Payer: 59 | Admitting: Thoracic Surgery (Cardiothoracic Vascular Surgery)

## 2011-02-17 ENCOUNTER — Ambulatory Visit: Payer: 59 | Admitting: Thoracic Surgery (Cardiothoracic Vascular Surgery)

## 2011-03-03 ENCOUNTER — Ambulatory Visit: Payer: 59 | Admitting: Thoracic Surgery (Cardiothoracic Vascular Surgery)

## 2011-05-15 ENCOUNTER — Telehealth: Payer: Self-pay

## 2011-05-15 NOTE — Telephone Encounter (Signed)
Pt states she is not having any more issues with the rt groin and that she did take your advice, wearing the compression stockings but still not helping with the swelling in rt lower leg. She has no f/u appts scheduled. Please advise.

## 2011-05-19 NOTE — Telephone Encounter (Signed)
Notified pt that Dr Cornelius Moras recommended wearing compression stocking for her venous insufficiency. Also recommended avoiding long periods of sitting and standing.  Pt was offered appt with Dr Cornelius Moras to discuss further if she needed. She denied appt and said she would call back if swelling was not controlled with stockings or increased.

## 2012-12-29 IMAGING — CR DG CHEST 2V
2 series · 2 of 2 positions shown · non-contrast
Comparison: 03/09/2010 and other priors dating back to 03/05/2010

CLINICAL DATA: Recent cardiac surgery, chest tube removal

CHEST - 2 VIEW

[w chest pa]
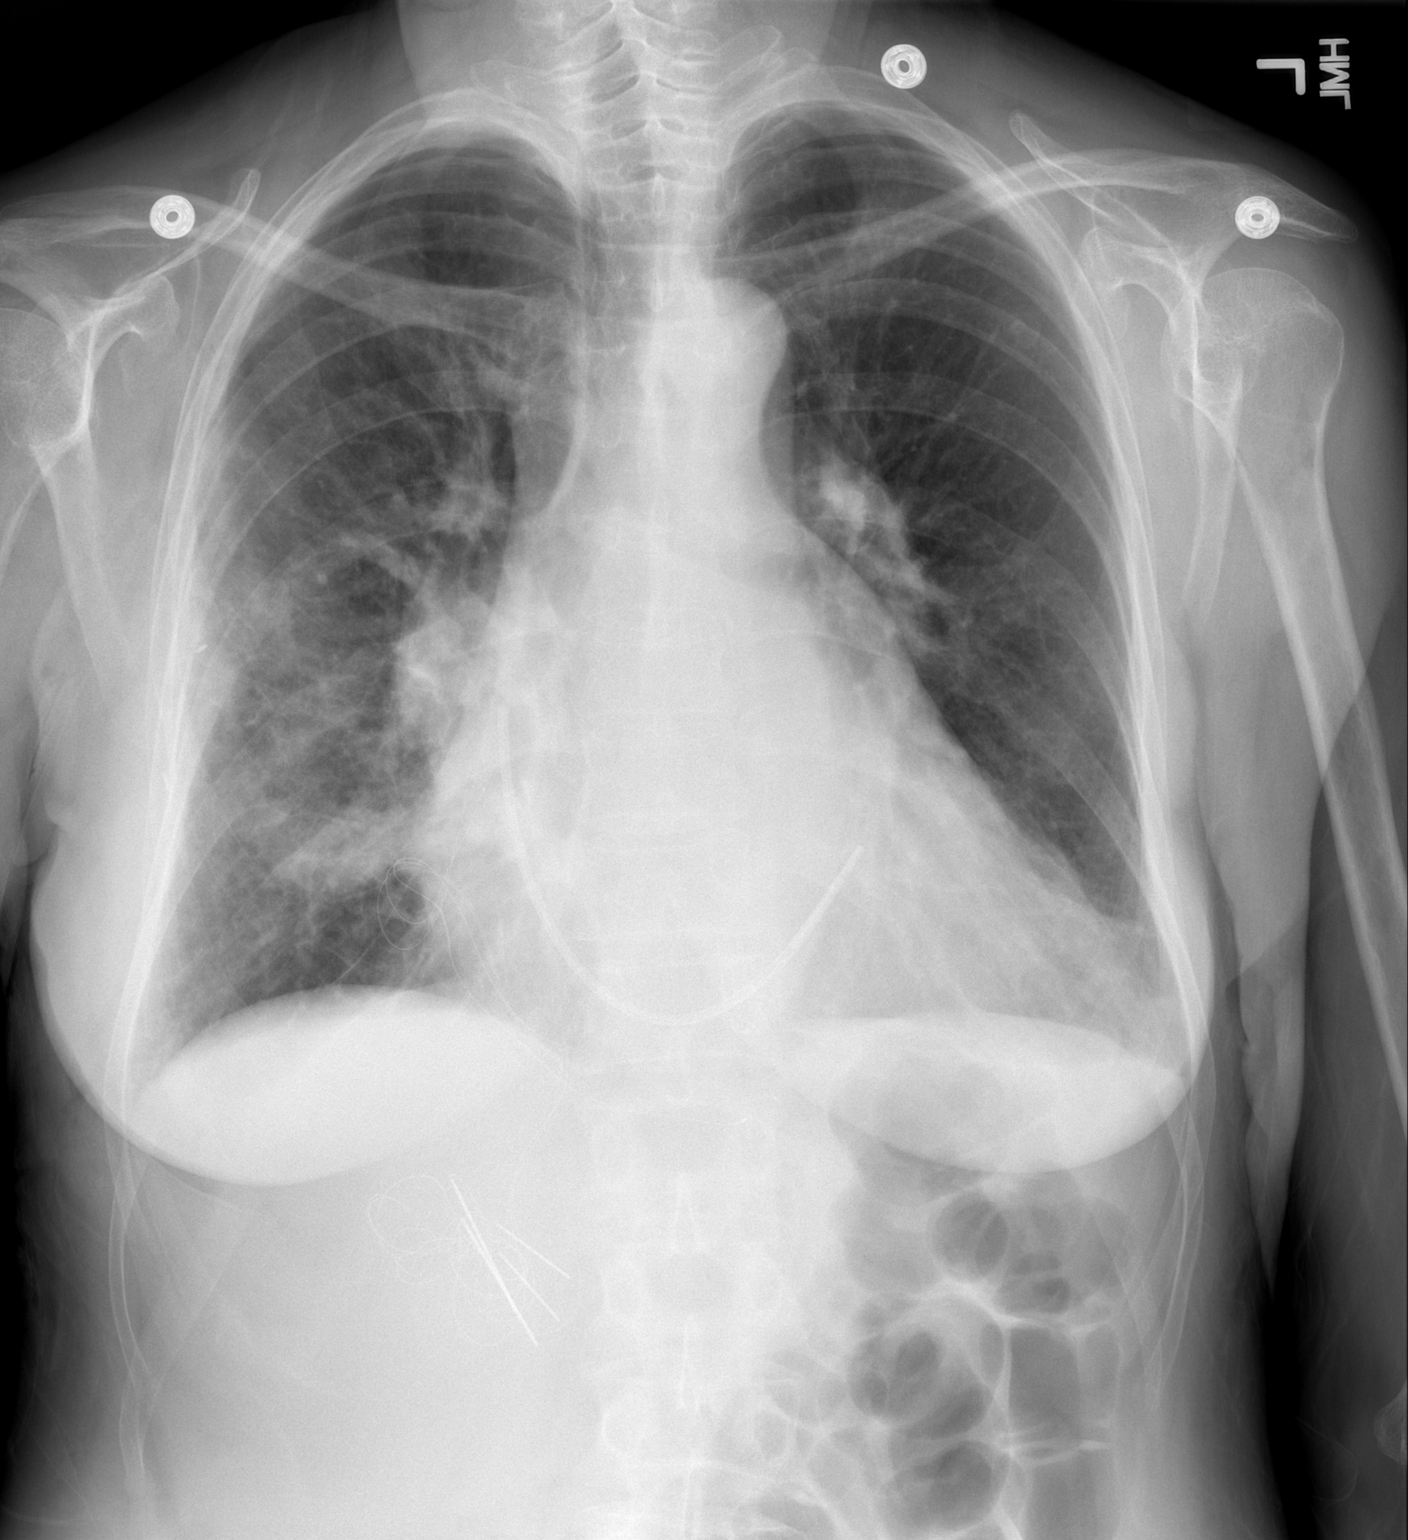

[w chest lat]
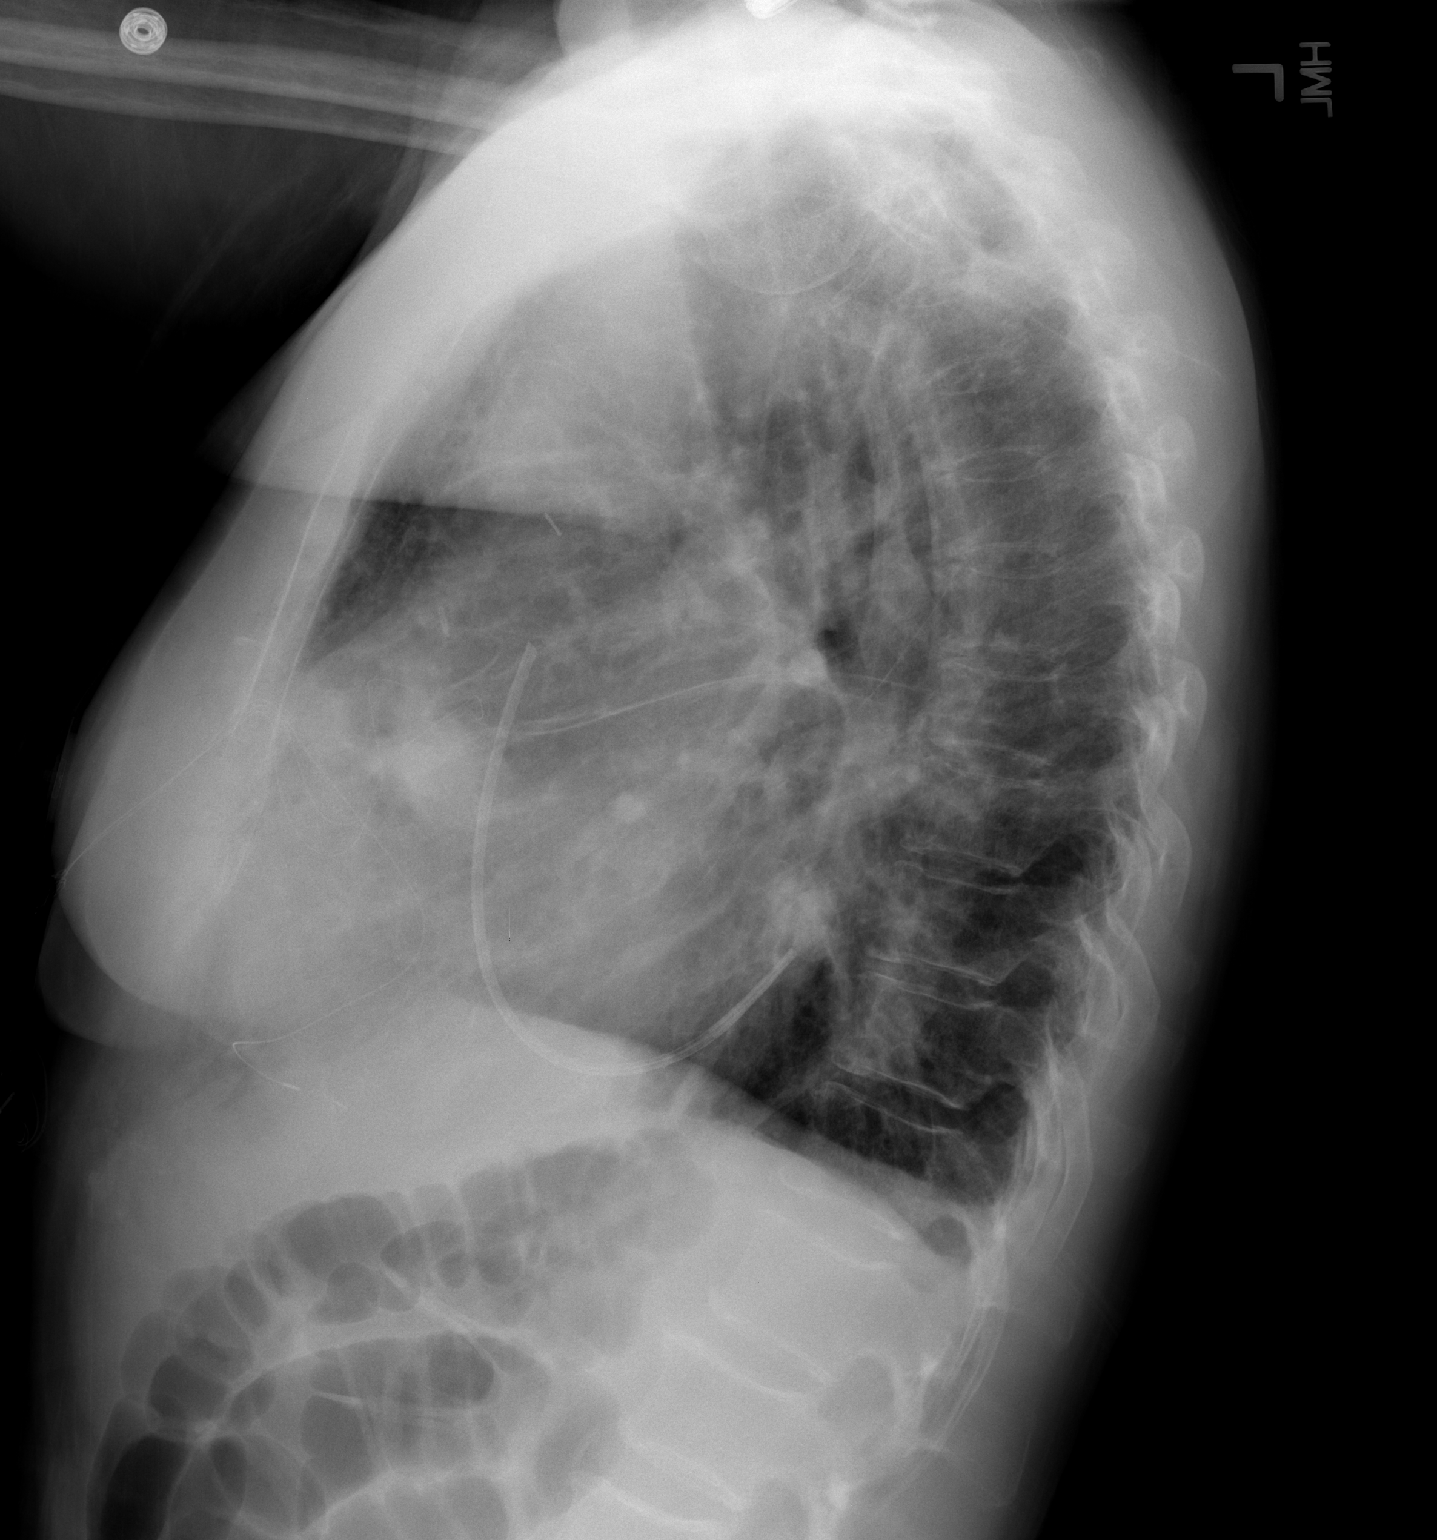

[2 of 2 positions shown; findings below may reference images not displayed]

FINDINGS: Curved radiopaque tubing projects over the cardiac
silhouette on both the frontal and lateral views.  Over the course
of the last 5 days, this appears to have migrated and is concerning
for a retained catheter fragment in the heart.  Epicardial pacing
wires noted.  Cardiac silhouette remains enlarged with vascular
congestion and residual scattered atelectasis.  No effusion or
pneumothorax.  Midline trachea.
IMPRESSION: Radiopaque tubing projecting over the cardiac silhouette, which
appears to have migrated concerning for retained catheter fragment.

Critical test results telephoned to Dr. Rtoyota at the time of
interpretation on 03/10/2010 at [DATE] a.m.

## 2012-12-29 IMAGING — CT CT ANGIO CHEST
2 of 8 series · 18 of 46 positions shown · IV contrast (APPLIED)
Comparison: 03/10/2010 and several prior chest x-rays

CLINICAL DATA: Recent mini thoracotomy for ASD closure, chest x-
ray findings concerning for a retained catheter fragment within the
heart, versus foreign body.

CT ANGIOGRAPHY CHEST WITH CONTRAST
TECHNIQUE: Multidetector CT imaging of the chest was performed
using the standard protocol during bolus administration of
intravenous contrast.  Multiplanar CT image reconstructions
including MIPs were obtained to evaluate the vascular anatomy.
Contrast:  100 ml Omnipaque-QUU

[Series 5: dissection 2.0 st · axial · 0.59mm/px · z∈[-289,+1]mm · 16 of 161 slices shown]
[im 8/161  lung]
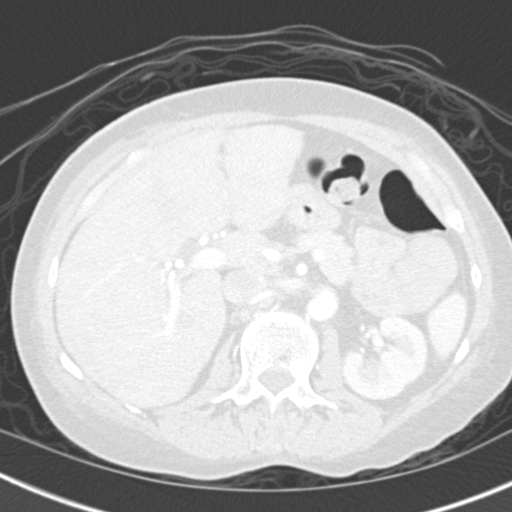
[im 15/161  soft-tissue]
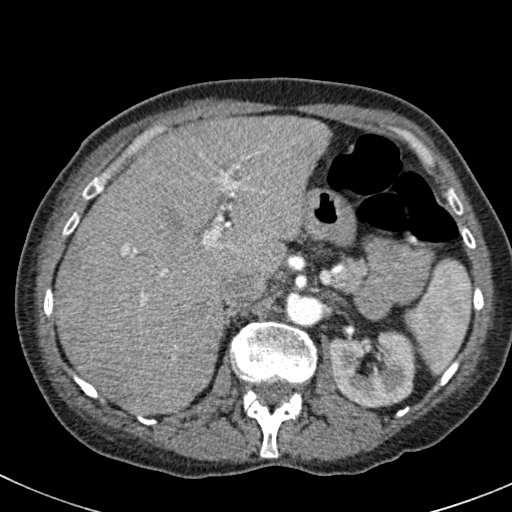
[im 30/161  lung]
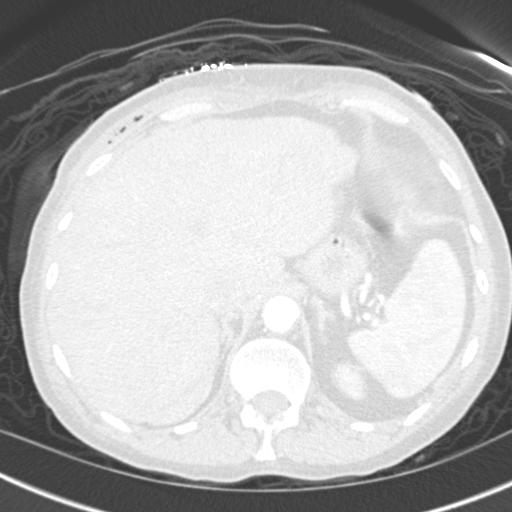
[im 37/161  soft-tissue]
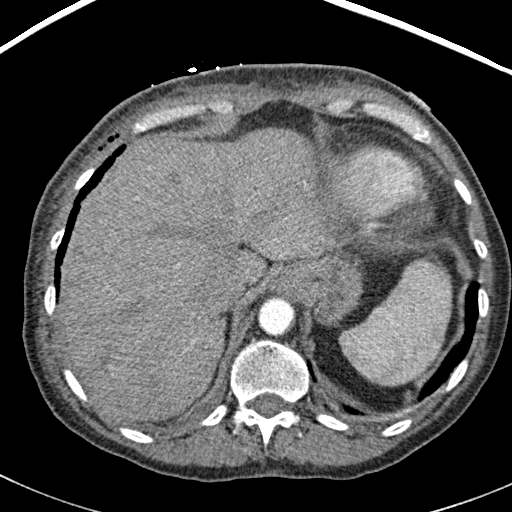
[im 44/161  lung]
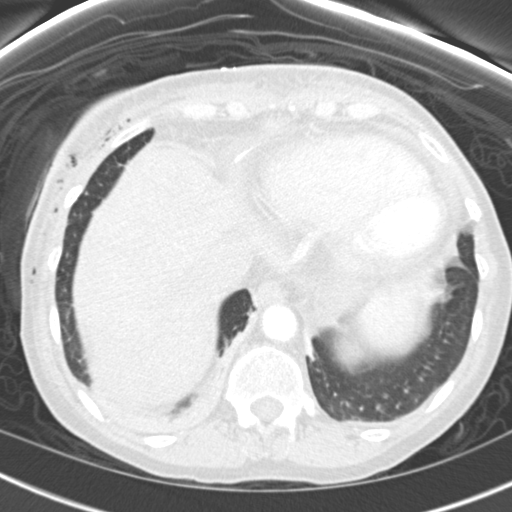
[im 59/161  soft-tissue]
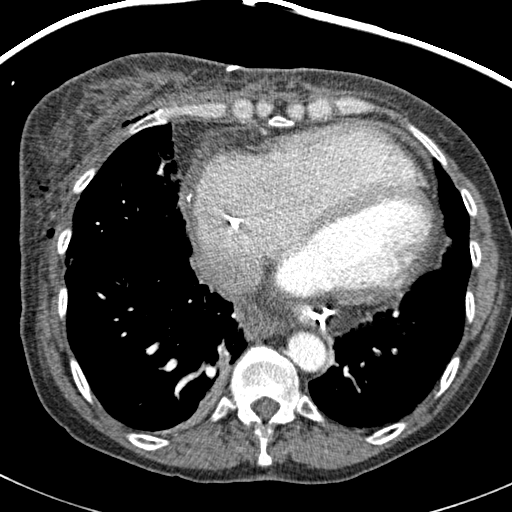
[im 66/161  lung]
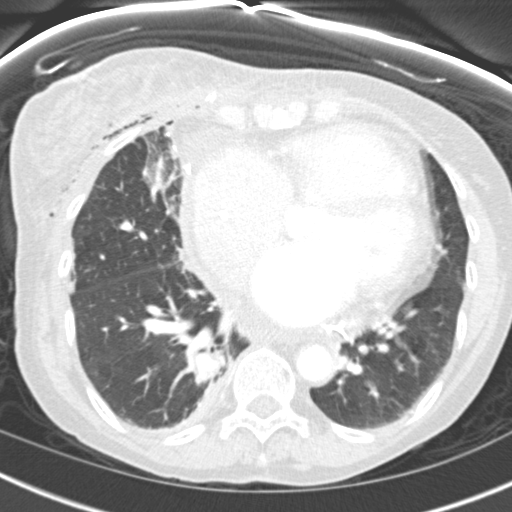
[im 73/161  soft-tissue]
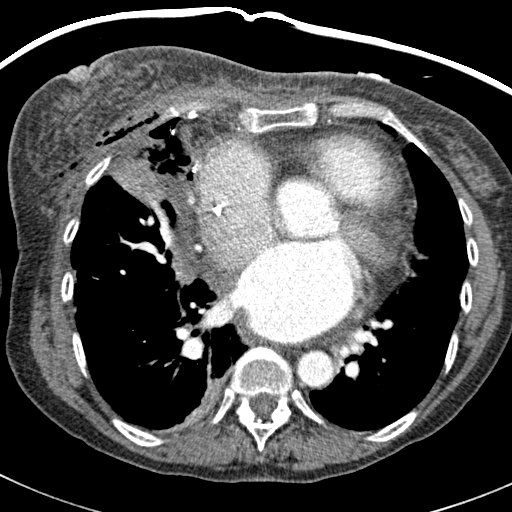
[im 88/161  lung]
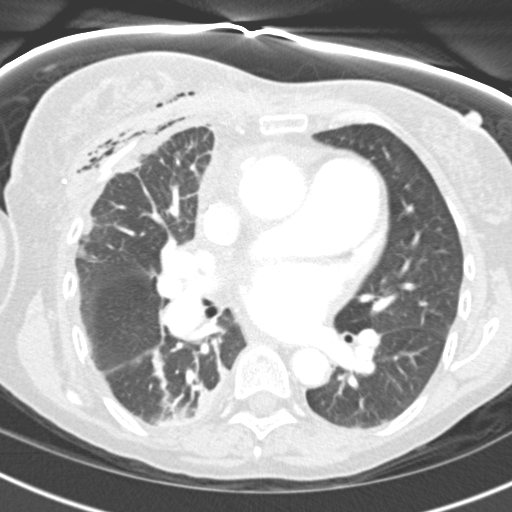
[im 95/161  soft-tissue]
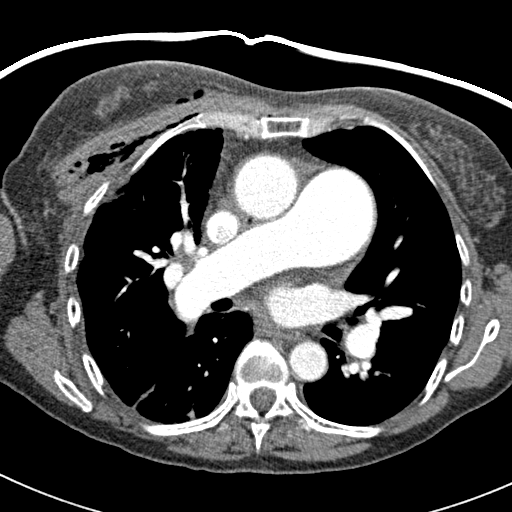
[im 102/161  lung]
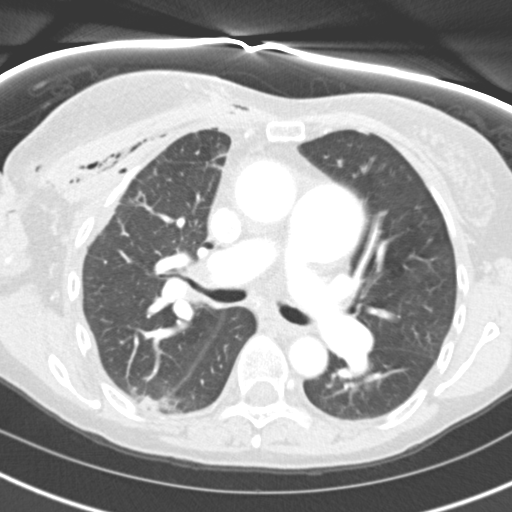
[im 117/161  soft-tissue]
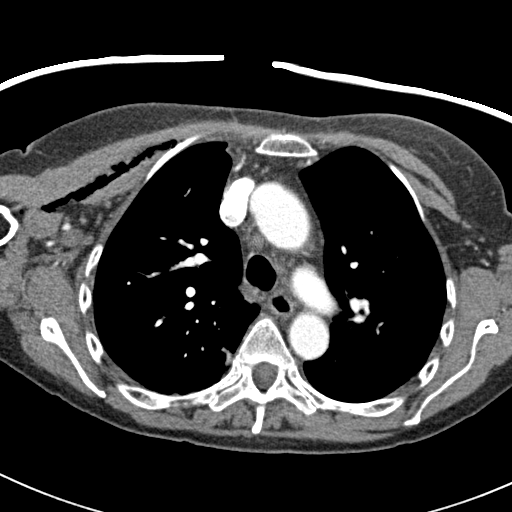
[im 124/161  lung]
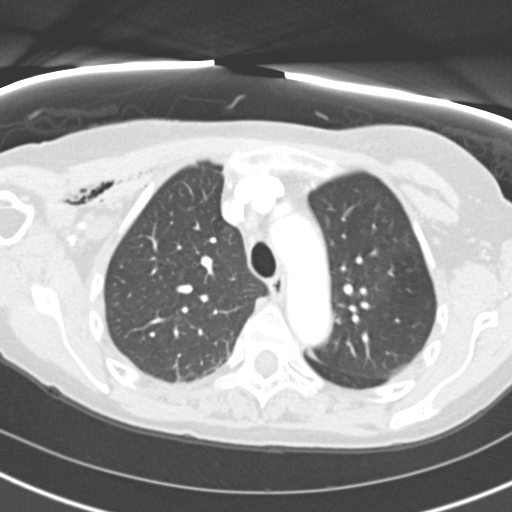
[im 131/161  soft-tissue]
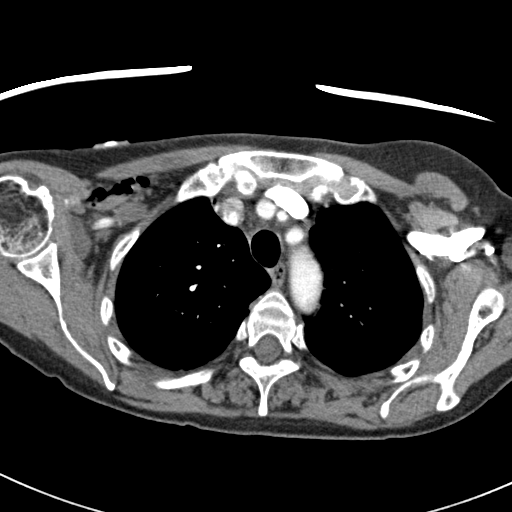
[im 146/161  lung]
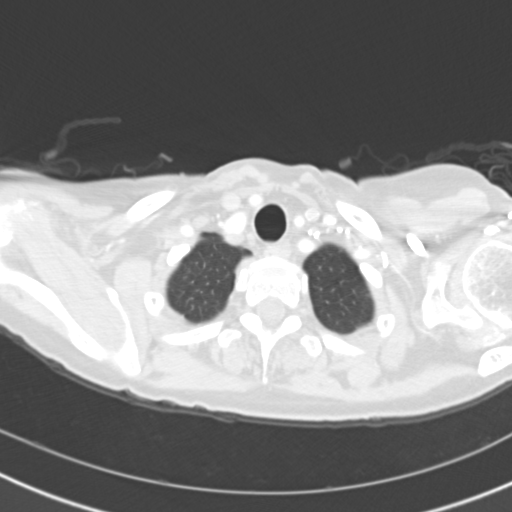
[im 153/161  soft-tissue]
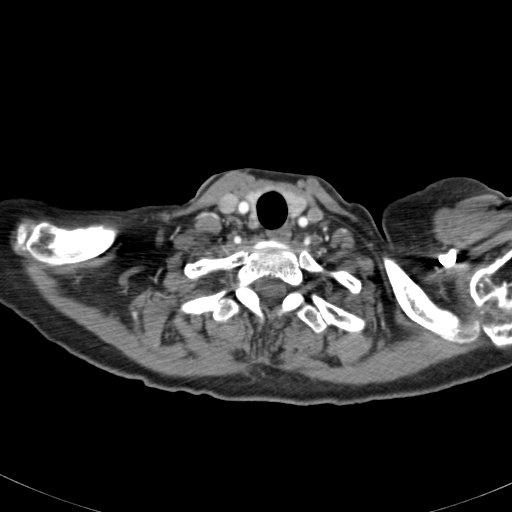

[Series 602: <mpr thick range> · coronal · 0.63mm/px · 2 of 106 slices shown]
[im 36/106  soft-tissue]
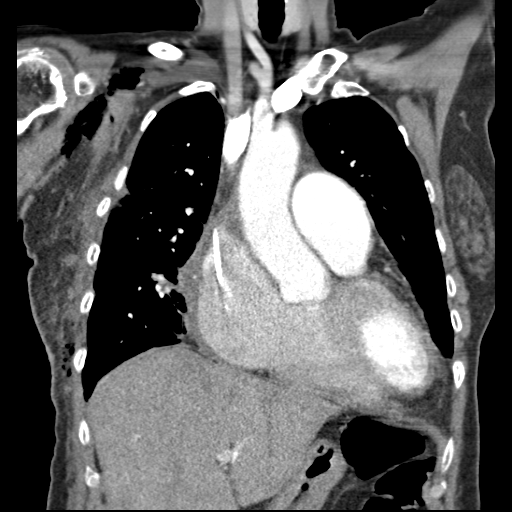
[im 71/106  soft-tissue]
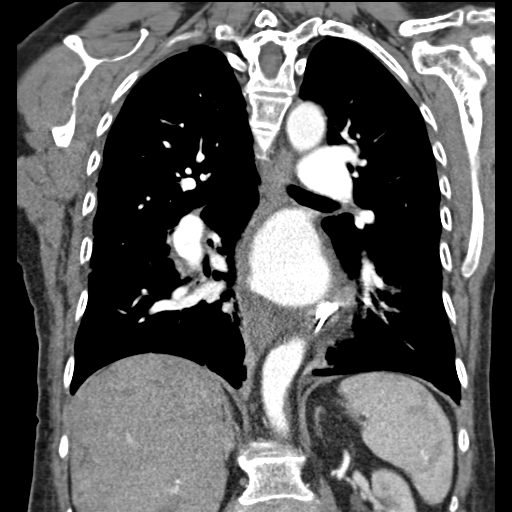

[18 of 46 positions shown; findings below may reference images not displayed]

FINDINGS: Gently curved radiopaque tubing noted extending from the
lower SVC through the right atrium and terminating within the
coronary sinus consistent with a migrated retained catheter
fragment when correlated with the chest x-rays.

Mild cardiac enlargement noted.  Small pericardial effusion
evident.  Trace right pleural fluid noted dependently.

Intact thoracic aorta.  Pulmonary arteries are patent centrally.
Postop changes noted from recent cardiothoracic surgery with
subcutaneous air present in the right sub pectoralis region
extending over the right anterior chest.  Small amount of air
within the mediastinum also presumably postoperative.

Epicardial pacing wires also present.  Lung windows demonstrate a
minuscule anterior right pneumothorax.  Scattered bibasilar, right
middle lobe, and right upper lobe atelectasis.  Right  anterior
third and fourth rib fractures noted presumably along the mini
thoracotomy tract.

Upper abdominal imaging demonstrates no acute process.

Review of the MIP images confirms the above findings.
IMPRESSION: Tubular radiopaque foreign body within the lower SVC through the
right atrium extending into the coronary sinus consistent with a
retained catheter fragment when correlated with prior chest films.

Postop changes in the right hemithorax from recent cardiothoracic
surgery.

Critical test results telephoned to Dr. Ocha at the time of
interpretation on 03/10/2010 at [DATE].

## 2012-12-30 IMAGING — US IR TRANSCATH RETRIEVAL FB
1 series · 1 of 1 positions shown · non-contrast
Comparison: Chest CT and chest x-ray dated 03/10/2010.

CLINICAL DATA: Status post atrial septal defect repair.  Retained
catheter fragment has been noted by chest x-ray and CT extending
from the lower SVC into the right heart.

1.  ULTRASOUND GUIDANCE FOR VASCULAR ACCESS OF THE RIGHT COMMON
FEMORAL VEIN
2.  TRANSCATHETER FOREIGN BODY RETRIEVAL

[Series 1: ir transcath retrieval fb · 1 of 1 slices shown]
[im 1/1]
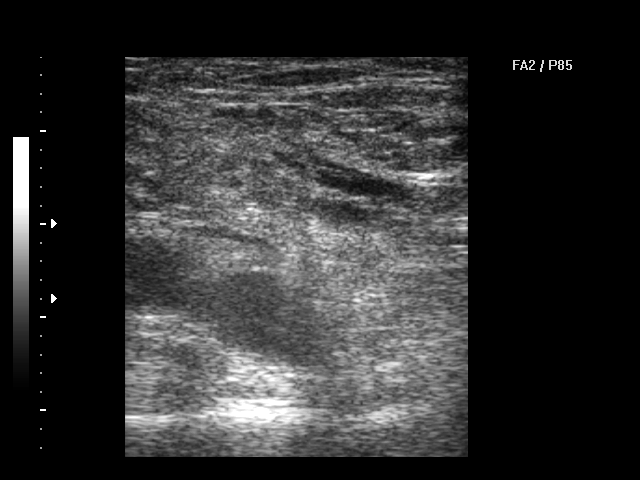

[1 of 1 positions shown; findings below may reference images not displayed]

Sedation: 4.0 mg IV Versed; 150 mcg IV Fentanyl.

Total Moderate Sedation Time: 60 minutes.

Fluoroscopy Time: 24.7 minutes.

Procedure:  The procedure, risks, benefits, and alternatives were
explained to the patient.  Questions regarding the procedure were
encouraged and answered.  The patient understands and consents to
the procedure.

The right groin was prepped with Betadine in a sterile fashion, and
a sterile drape was applied covering the operative field.  A
sterile gown and sterile gloves were used for the procedure. Local
anesthesia was provided with 1% Lidocaine.

Access of the right common femoral vein was performed under direct
ultrasound guidance with a micropuncture [DATE]-French vascular
sheath was placed.  A guidewire was then advanced through the
inferior vena cava and to the level of the superior vena cava with
assistance of a 5-French diagnostic catheter.

Initial attempt was made to snare the superior end of the retained
catheter fragment at the level of the SVC utilizing an EnSnare
device with a standard snare 6-French catheter.

A 6-French guiding catheter with angled tip was then advanced into
the SVC.  Additional attempt was made to snare the catheter
fragment with the snare device as well as a 25 mm Amplatz loop
snare.

Further manipulation was then performed by utilizing both 5-Ebony
Tepace and Sos catheters to try to move the proximal end of the
catheter.  This did result in slight change in catheter position.
A 35-mm Amplatz loop snare was then advanced through the guiding
catheter and used to snare the superior portion of the catheter.
The catheter was then gently retracted into the inferior vena cava.
The catheter was further retracted into the common femoral vein.

A 9-French sheath was then advanced over the loop snare at the
level of the femoral venotomy.  The catheter fragment and sheath
were then able to be retracted as a unit out of the body.
Hemostasis was obtained with manual compression.

Complications: None
FINDINGS: Initial fluoroscopy demonstrates stable positioning of
the retained catheter fragment compared to imaging yesterday with
the proximal portion extending up into the lower SVC and the distal
portion situated at the level of the coronary sinus.

Initial attempts to snare the catheter were unsuccessful due to
positioning of the catheter tip along the anterolateral wall of the
SVC.  Ultimately, the catheter was able to be manipulated and
changed in position enough to allow advancement of the snare around
its superior end.  This allowed retraction of the entire catheter
fragment to the level of the femoral venous access.

In order to straighten the catheter enough to allow it to be
removed, sheath access was upsized to 9-French.  After removal, an
intact catheter fragment measuring approximately 17 cm in length
was inspected.  This represents the distal aspect of a pulmonary
pressure monitoring catheter with a balloon present at its tip.
The end of the catheter located in the SVC is severed.  The
catheter is approximately 8-French in outer diameter.  Fluoroscopy
after the procedure confirms complete removal of the intravascular
foreign body.
IMPRESSION: Successful removal of retained catheter fragment as above.  The
fragment was removed via right femoral venous access after snaring
the severed end of the catheter located in the SVC.

## 2019-03-08 ENCOUNTER — Other Ambulatory Visit: Payer: Self-pay | Admitting: Pharmacy Technician

## 2019-03-08 NOTE — Patient Outreach (Signed)
[facility]) Care Management  03/08/2019  AZARYAH MOREIN 06/22/1950 [id number]   Error.  Lailee Hoelzel P. Helyne Genther, [place] Management [phone]
